# Patient Record
Sex: Male | Born: 1940 | Race: White | Hispanic: No | Marital: Married | Smoking: Never smoker
Health system: Southern US, Community
[De-identification: ages and names within clinical notes are randomized; demographics above are authoritative.]

## PROBLEM LIST (undated history)

## (undated) DIAGNOSIS — I10 Essential (primary) hypertension: Secondary | ICD-10-CM

## (undated) DIAGNOSIS — H409 Unspecified glaucoma: Secondary | ICD-10-CM

## (undated) DIAGNOSIS — K219 Gastro-esophageal reflux disease without esophagitis: Secondary | ICD-10-CM

## (undated) DIAGNOSIS — M1990 Unspecified osteoarthritis, unspecified site: Secondary | ICD-10-CM

## (undated) HISTORY — DX: Unspecified osteoarthritis, unspecified site: M19.90

## (undated) HISTORY — DX: Gastro-esophageal reflux disease without esophagitis: K21.9

## (undated) HISTORY — DX: Essential (primary) hypertension: I10

## (undated) HISTORY — DX: Unspecified glaucoma: H40.9

---

## 1970-12-31 HISTORY — PX: VASECTOMY: SHX75

## 1979-09-01 HISTORY — PX: TONSILLECTOMY AND ADENOIDECTOMY: SUR1326

## 1988-12-31 HISTORY — PX: ROTATOR CUFF REPAIR: SHX139

## 1992-12-31 HISTORY — PX: CARPAL TUNNEL RELEASE: SHX101

## 2014-03-24 ENCOUNTER — Encounter: Payer: Self-pay | Admitting: *Deleted

## 2014-04-20 ENCOUNTER — Encounter: Payer: Self-pay | Admitting: Family Medicine

## 2014-04-20 ENCOUNTER — Ambulatory Visit (INDEPENDENT_AMBULATORY_CARE_PROVIDER_SITE_OTHER): Payer: Medicare Other | Admitting: Family Medicine

## 2014-04-20 VITALS — BP 138/82 | HR 62 | Temp 97.6°F | Resp 14 | Ht 72.0 in | Wt 237.0 lb

## 2014-04-20 DIAGNOSIS — N529 Male erectile dysfunction, unspecified: Secondary | ICD-10-CM

## 2014-04-20 DIAGNOSIS — M7730 Calcaneal spur, unspecified foot: Secondary | ICD-10-CM

## 2014-04-20 DIAGNOSIS — D229 Melanocytic nevi, unspecified: Secondary | ICD-10-CM

## 2014-04-20 DIAGNOSIS — I10 Essential (primary) hypertension: Secondary | ICD-10-CM

## 2014-04-20 DIAGNOSIS — M159 Polyosteoarthritis, unspecified: Secondary | ICD-10-CM

## 2014-04-20 DIAGNOSIS — R0602 Shortness of breath: Secondary | ICD-10-CM

## 2014-04-20 DIAGNOSIS — B354 Tinea corporis: Secondary | ICD-10-CM

## 2014-04-20 DIAGNOSIS — Z85828 Personal history of other malignant neoplasm of skin: Secondary | ICD-10-CM

## 2014-04-20 DIAGNOSIS — D239 Other benign neoplasm of skin, unspecified: Secondary | ICD-10-CM

## 2014-04-20 MED ORDER — CLOTRIMAZOLE-BETAMETHASONE 1-0.05 % EX CREA: 1.0000 "application " | TOPICAL_CREAM | Freq: Two times a day (BID) | CUTANEOUS | Status: DC

## 2014-04-20 MED ORDER — TADALAFIL 20 MG PO TABS: 20.0000 mg | ORAL_TABLET | Freq: Every day | ORAL | Status: DC | PRN

## 2014-04-20 NOTE — Progress Notes (Signed)
Patient ID: Logan Cruz, male   DOB: [birth year], 73 y.o.   MRN: [id number]   Subjective:    Patient ID: Logan Cruz, male    DOB: [birth year], 73 y.o.   MRN: [id number]  Patient presents for New patient CPE, R heel swelling and pain and R leg under knee- dry patchy skin  issue here to establish care. Previous PCP was in [place]. His medications and history were reviewed. He is history of hypertension and acid reflux. He is what pressure has been well-controlled on his current medications. He does complain today about a rash that has been on his left leg for the past month or so. He has tried using some topical hydrocortisone helps with the itch but the rash is still present. He also has noticed a swelling on his left heel for the past couple months which is becoming more more painful with walking. He's not had any particular injury. 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 He is often seen by dermatology in 6 months a cyst because of his recurrent history he's had Mohs procedure done in the past as well. 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, physiological and [place]:  GEN- denies fatigue, fever, weight loss,weakness, recent illness HEENT- denies eye drainage, change in vision, nasal discharge, CVS- denies chest pain, palpitations RESP- denies SOB, cough, wheeze ABD- denies N/V, change in stools, abd pain GU- denies dysuria, hematuria, dribbling, incontinence MSK- +joint pain, muscle aches, injury Neuro- denies headache, dizziness, syncope, seizure activity       Objective:    BP 138/82  Pulse 62  Temp(Src) 97.6 F (36.4 C) (Oral)  Resp 14  Ht 6' (1.829 m)  Wt 237 lb (107.502 kg)  BMI 32.14 kg/m2 GEN- NAD, alert and oriented x3 HEENT- PERRL,  EOMI, non injected sclera, pink conjunctiva, MMM, oropharynx clear, Wears hearing Aide, Canals with mild wax Neck- Supple,  CVS- RRR, no murmur RESP-CTAB ABD-NABS,soft,NT,ND Skin- erythematous scaley lesion left leg- multiple moles, seborrheic keratosis, tiny hemangiomas, sun damage EXT- No edema, Swelling of left heel and achilles tendon insertion region, mild TTP no erythema,Fair ROM left ankle Pulses- Radial, DP- 2+        Assessment & Plan:      Problem List Items Addressed This Visit   Tinea corporis   Relevant Medications      LOTRISONE 1-0.05 % EX CREA   SOB (shortness of breath)   Heel spur   Relevant Orders      DG Foot Complete Left   Generalized OA   Essential hypertension, benign - Primary   Relevant Medications      tadalafil (CIALIS) 20 MG tablet   Erectile dysfunction      Note: This dictation was prepared with Dragon dictation along with smaller phrase technology. Any transcriptional errors that result from this process are unintentional.

## 2014-04-20 NOTE — Assessment & Plan Note (Signed)
Cialis to be refill

## 2014-04-20 NOTE — Assessment & Plan Note (Signed)
Continue Celebrex. 

## 2014-04-20 NOTE — Assessment & Plan Note (Signed)
Blood pressure well controlled

## 2014-04-20 NOTE — Assessment & Plan Note (Signed)
X-ray of foot and heel and refer to a dietitian. He might have some calcifications along the Achilles tendon as well

## 2014-04-20 NOTE — Assessment & Plan Note (Signed)
He's been evaluated by cardiology as well as had imaging of the chest for shortness of breath this is thought to be due to deconditioning.

## 2014-04-20 NOTE — Assessment & Plan Note (Signed)
Lotrisone cream Reviewed by KOH

## 2014-04-20 NOTE — Patient Instructions (Signed)
Continue current medications Get the xray done of your heel [place]  Return for fasting labs  Referral to podiatry Use cream for the rash  Referral to dermatology Release of [address] and [place] /  [facility] Dermatology F/U 3 months

## 2014-04-21 ENCOUNTER — Other Ambulatory Visit: Payer: Medicare Other

## 2014-04-21 DIAGNOSIS — R0602 Shortness of breath: Secondary | ICD-10-CM

## 2014-04-21 DIAGNOSIS — I10 Essential (primary) hypertension: Secondary | ICD-10-CM

## 2014-04-21 DIAGNOSIS — Z0000 Encounter for general adult medical examination without abnormal findings: Secondary | ICD-10-CM

## 2014-04-21 DIAGNOSIS — M159 Polyosteoarthritis, unspecified: Secondary | ICD-10-CM

## 2014-04-21 LAB — CBC WITH DIFFERENTIAL/PLATELET
BASOS PCT: 0 % (ref 0–1)
Basophils Absolute: 0 10*3/uL (ref 0.0–0.1)
EOS PCT: 1 % (ref 0–5)
Eosinophils Absolute: 0.1 10*3/uL (ref 0.0–0.7)
HEMATOCRIT: 40.7 % (ref 39.0–52.0)
Hemoglobin: 14.1 g/dL (ref 13.0–17.0)
LYMPHS PCT: 25 % (ref 12–46)
Lymphs Abs: 1.4 10*3/uL (ref 0.7–4.0)
MCH: 29.3 pg (ref 26.0–34.0)
MCHC: 34.6 g/dL (ref 30.0–36.0)
MCV: 84.6 fL (ref 78.0–100.0)
MONO ABS: 0.6 10*3/uL (ref 0.1–1.0)
Monocytes Relative: 11 % (ref 3–12)
Neutro Abs: 3.5 10*3/uL (ref 1.7–7.7)
Neutrophils Relative %: 63 % (ref 43–77)
Platelets: 156 10*3/uL (ref 150–400)
RBC: 4.81 MIL/uL (ref 4.22–5.81)
RDW: 14.4 % (ref 11.5–15.5)
WBC: 5.5 10*3/uL (ref 4.0–10.5)

## 2014-04-21 LAB — LIPID PANEL
Cholesterol: 169 mg/dL (ref 0–200)
HDL: 29 mg/dL — AB (ref >39)
LDL CALC: 115 mg/dL — AB (ref 0–99)
Total CHOL/HDL Ratio: 5.8 Ratio
Triglycerides: 125 mg/dL (ref <150)
VLDL: 25 mg/dL (ref 0–40)

## 2014-04-21 LAB — COMPLETE METABOLIC PANEL WITH GFR
ALT: 22 U/L (ref 0–53)
AST: 20 U/L (ref 0–37)
Albumin: 3.8 g/dL (ref 3.5–5.2)
Alkaline Phosphatase: 114 U/L (ref 39–117)
BUN: 20 mg/dL (ref 6–23)
CO2: 27 mEq/L (ref 19–32)
Calcium: 8.6 mg/dL (ref 8.4–10.5)
Chloride: 106 mEq/L (ref 96–112)
Creat: 0.88 mg/dL (ref 0.50–1.35)
GFR, Est African American: >89 mL/min
GFR, Est Non African American: 86 mL/min
Glucose, Bld: 99 mg/dL (ref 70–99)
Potassium: 4.5 mEq/L (ref 3.5–5.3)
Sodium: 141 mEq/L (ref 135–145)
Total Bilirubin: 0.4 mg/dL (ref 0.2–1.2)
Total Protein: 6.8 g/dL (ref 6.0–8.3)

## 2014-04-22 LAB — PSA, MEDICARE: PSA: 1.49 ng/mL (ref <=4.00)

## 2014-04-30 ENCOUNTER — Encounter: Payer: Self-pay | Admitting: Podiatry

## 2014-04-30 ENCOUNTER — Ambulatory Visit (INDEPENDENT_AMBULATORY_CARE_PROVIDER_SITE_OTHER): Payer: Medicare Other | Admitting: Podiatry

## 2014-04-30 VITALS — BP 150/90 | HR 49 | Ht 72.0 in | Wt 237.0 lb

## 2014-04-30 DIAGNOSIS — M659 Synovitis and tenosynovitis, unspecified: Secondary | ICD-10-CM | POA: Insufficient documentation

## 2014-04-30 DIAGNOSIS — M7730 Calcaneal spur, unspecified foot: Secondary | ICD-10-CM

## 2014-04-30 MED ORDER — LIDOCAINE 5 % EX PTCH: 1.0000 | MEDICATED_PATCH | CUTANEOUS | Status: DC

## 2014-04-30 NOTE — Patient Instructions (Signed)
Seen left heel pain.  Will try ankle brace and arch binder. Need Custom orthotics. Return in 2 weeks.

## 2014-04-30 NOTE — Progress Notes (Signed)
Subjective: 73 year old male presents accompanied by wife complaining of pain in left posterior medial heel since last Summer and the bump is getting bigger. Also has pain on upper part of instep area left foot since yesterday. He is on feet constantly doing household projects.   Review of Systems - General ROS: negative for - chills, fatigue, fever, night sweats, sleep disturbance, weight gain or weight loss Ophthalmic ROS: Need new glasses. ENT ROS: negative Endocrine ROS: negative Respiratory ROS: Started to have difficulty breathing. Been checked out and no abnormal findings. Cardiovascular ROS: no chest pain or dyspnea on exertion Gastrointestinal ROS: no abdominal pain, change in bowel habits, or black or bloody stools Genito-Urinary ROS: Been checked out for Prostate.  Musculoskeletal ROS: negative Neurological ROS: no TIA or stroke symptoms Dermatological ROS: Has had several skin cancers and been seeing Dermatologist.   Objective: Dermatologic: Normal nail no abnormal skin lesions. Vascular: All pedal pulses are palpable. No soft tissue edema or erythema noted at the affected site of left heel. There is minute increase in skin temperature on left posterior heel. Orthopedic: Cavus type foot with large posterior bump left foot at Achilles tendon attachment site.  Pain at left instep near Naviculocuneiform joint area with weight bearing.  Radiographic examination reveal high arch foot with posterior spur and increased sclerosis at tendon attachment site left heel posterior.  No other significant abnormal findings.   Assessment: 1. Haglund's deformity left heel.  2. Tenosynovitis left instep at about Naviculocuneiform joint.   Plan: Reviewed clinical findings and available treatment options. Arch binder with pad dispensed for the left foot to wear during weight bearing. Ankle support dispensed to limit excess forefoot abduction during ambulation .  Explained the benefit of Custom  orthotics.  Will try Lidoderm patch as needed.

## 2014-05-12 ENCOUNTER — Encounter: Payer: Self-pay | Admitting: Podiatry

## 2014-05-12 ENCOUNTER — Ambulatory Visit (INDEPENDENT_AMBULATORY_CARE_PROVIDER_SITE_OTHER): Payer: Medicare Other | Admitting: Podiatry

## 2014-05-12 VITALS — BP 151/82 | HR 53

## 2014-05-12 DIAGNOSIS — M659 Synovitis and tenosynovitis, unspecified: Secondary | ICD-10-CM

## 2014-05-12 DIAGNOSIS — M79609 Pain in unspecified limb: Secondary | ICD-10-CM

## 2014-05-12 DIAGNOSIS — M7730 Calcaneal spur, unspecified foot: Secondary | ICD-10-CM

## 2014-05-12 MED ORDER — DICLOFENAC EPOLAMINE 1.3 % TD PTCH: 1.0000 | MEDICATED_PATCH | Freq: Two times a day (BID) | TRANSDERMAL | Status: DC

## 2014-05-12 NOTE — Patient Instructions (Signed)
Seen for left foot and heel pain. Improving with arch pad, ankle support, and Lidoderm patch. Will try Flector patch.  Lost arch pad replaced. Return as needed.

## 2014-05-12 NOTE — Progress Notes (Signed)
Subjective:  73 year old male presents accompanied by wife stating that ankle brace, metatarsal binder, and patches helped. Near instep area pain has subsided. Back of heel still hurt upon ambulation.   Objective:  No new change other than some warmth at the posterior heel left.   Assessment: 1. Haglund's deformity with inflammation left heel.  2. Tenosynovitis left instep at about Naviculocuneiform joint, improving with treatment.   Plan: Reviewed clinical findings and available treatment options.  Lost Arch binder with pad replaced.  Continue with Ankle support dispensed to limit excess forefoot abduction during ambulation .  Explained the benefit of Custom orthotics.  Will try Flector patch in place of Lidoderm patch to decrease inflammation.

## 2014-05-14 ENCOUNTER — Ambulatory Visit: Payer: Medicare Other | Admitting: Podiatry

## 2014-07-21 ENCOUNTER — Ambulatory Visit (INDEPENDENT_AMBULATORY_CARE_PROVIDER_SITE_OTHER): Payer: Medicare Other | Admitting: Family Medicine

## 2014-07-21 ENCOUNTER — Encounter: Payer: Self-pay | Admitting: Family Medicine

## 2014-07-21 VITALS — BP 142/80 | HR 64 | Temp 97.8°F | Resp 12 | Ht 71.0 in | Wt 235.0 lb

## 2014-07-21 DIAGNOSIS — M159 Polyosteoarthritis, unspecified: Secondary | ICD-10-CM

## 2014-07-21 DIAGNOSIS — Z23 Encounter for immunization: Secondary | ICD-10-CM

## 2014-07-21 DIAGNOSIS — I10 Essential (primary) hypertension: Secondary | ICD-10-CM

## 2014-07-21 NOTE — Assessment & Plan Note (Signed)
Continue use of Celebrex as needed. Currently he is using more of the diclofenac patch

## 2014-07-21 NOTE — Patient Instructions (Signed)
Continue current medications Prevnar 13 given  F/U 6 months

## 2014-07-21 NOTE — Assessment & Plan Note (Signed)
Blood pressure is well-controlled no change the medication. His last set of labs were reviewed which were unremarkable

## 2014-07-21 NOTE — Progress Notes (Signed)
Patient ID: Logan Cruz, male   DOB: February 02, 1941, 73 y.o.   MRN: [id number]   Subjective:    Patient ID: Logan Cruz, male    DOB: [birth year], 73 y.o.   MRN: [id number]  Patient presents for 3 month F/U  patient here to follow chronic medical problems. He has no specific concerns today. He still being followed by podiatry for heel spurs is currently using the Flector patch which helps. He is tolerating his medications without any difficulties. He does have numerous skin moles as well as some actinic keratoses across his for head which she's going to have treated in October.  He is due for Prevnar 13    Review Of Systems:  GEN- denies fatigue, fever, weight loss,weakness, recent illness HEENT- denies eye drainage, change in vision, nasal discharge, CVS- denies chest pain, palpitations RESP- denies SOB, cough, wheeze ABD- denies N/V, change in stools, abd pain GU- denies dysuria, hematuria, dribbling, incontinence MSK- denies joint pain, muscle aches, injury Neuro- denies headache, dizziness, syncope, seizure activity       Objective:    BP 142/80  Pulse 64  Temp(Src) 97.8 F (36.6 C) (Oral)  Resp 12  Ht 5\' 11"  (1.803 m)  Wt 235 lb (106.595 kg)  BMI 32.79 kg/m2 GEN- NAD, alert and oriented x3 HEENT- PERRL, EOMI, non injected sclera, pink conjunctiva, MMM, oropharynx clear Neck- Supple, CVS- RRR, no murmur RESP-CTAB Skin- multiple scaly raised erythematous lesions on his forehead, flesh colored papules at nape of neck and right occipital region, multiple seborrheic keratosis arms, legs EXT- No edema Pulses- Radial 2+        Assessment & Plan:      Problem List Items Addressed This Visit   None    Visit Diagnoses   Need for prophylactic vaccination against Streptococcus pneumoniae (pneumococcus)    -  Primary    Relevant Orders       Pneumococcal conjugate vaccine 13-valent (Completed)       Note: This dictation was prepared with Dragon dictation along  with smaller phrase technology. Any transcriptional errors that result from this process are unintentional.

## 2014-11-24 ENCOUNTER — Encounter: Payer: Self-pay | Admitting: Family Medicine

## 2014-12-27 ENCOUNTER — Telehealth: Payer: Self-pay | Admitting: Family Medicine

## 2014-12-27 MED ORDER — CELECOXIB 100 MG PO CAPS: 100.0000 mg | ORAL_CAPSULE | Freq: Every day | ORAL | Status: DC | PRN

## 2014-12-27 NOTE — Telephone Encounter (Signed)
Prescription sent to pharmacy.

## 2014-12-27 NOTE — Telephone Encounter (Signed)
[phone] [facility]  Pt is needing a refill on Celecoxib (CELEBREX PO) ( he is wanting it to go to [facility] because he is out)

## 2015-01-21 ENCOUNTER — Ambulatory Visit: Payer: Medicare Other | Admitting: Family Medicine

## 2015-01-24 ENCOUNTER — Encounter: Payer: Self-pay | Admitting: Family Medicine

## 2015-01-24 ENCOUNTER — Ambulatory Visit (INDEPENDENT_AMBULATORY_CARE_PROVIDER_SITE_OTHER): Payer: Medicare Other | Admitting: Family Medicine

## 2015-01-24 VITALS — BP 140/78 | HR 70 | Temp 99.8°F | Resp 14 | Ht 71.0 in | Wt 242.0 lb

## 2015-01-24 DIAGNOSIS — I10 Essential (primary) hypertension: Secondary | ICD-10-CM

## 2015-01-24 DIAGNOSIS — Z23 Encounter for immunization: Secondary | ICD-10-CM

## 2015-01-24 DIAGNOSIS — M75101 Unspecified rotator cuff tear or rupture of right shoulder, not specified as traumatic: Secondary | ICD-10-CM | POA: Insufficient documentation

## 2015-01-24 DIAGNOSIS — F0390 Unspecified dementia without behavioral disturbance: Secondary | ICD-10-CM | POA: Insufficient documentation

## 2015-01-24 DIAGNOSIS — R4189 Other symptoms and signs involving cognitive functions and awareness: Secondary | ICD-10-CM

## 2015-01-24 LAB — CBC WITH DIFFERENTIAL/PLATELET
Basophils Absolute: 0 10*3/uL (ref 0.0–0.1)
Basophils Relative: 0 % (ref 0–1)
Eosinophils Absolute: 0.1 10*3/uL (ref 0.0–0.7)
Eosinophils Relative: 1 % (ref 0–5)
HCT: 44.3 % (ref 39.0–52.0)
HEMOGLOBIN: 15.4 g/dL (ref 13.0–17.0)
LYMPHS ABS: 1.4 10*3/uL (ref 0.7–4.0)
LYMPHS PCT: 24 % (ref 12–46)
MCH: 29.1 pg (ref 26.0–34.0)
MCHC: 34.8 g/dL (ref 30.0–36.0)
MCV: 83.7 fL (ref 78.0–100.0)
MONOS PCT: 13 % — AB (ref 3–12)
MPV: 10.2 fL (ref 8.6–12.4)
Monocytes Absolute: 0.8 10*3/uL (ref 0.1–1.0)
NEUTROS ABS: 3.6 10*3/uL (ref 1.7–7.7)
Neutrophils Relative %: 62 % (ref 43–77)
Platelets: 165 10*3/uL (ref 150–400)
RBC: 5.29 MIL/uL (ref 4.22–5.81)
RDW: 13.3 % (ref 11.5–15.5)
WBC: 5.8 10*3/uL (ref 4.0–10.5)

## 2015-01-24 LAB — COMPREHENSIVE METABOLIC PANEL
ALK PHOS: 116 U/L (ref 39–117)
ALT: 22 U/L (ref 0–53)
AST: 23 U/L (ref 0–37)
Albumin: 4 g/dL (ref 3.5–5.2)
BILIRUBIN TOTAL: 0.5 mg/dL (ref 0.2–1.2)
BUN: 18 mg/dL (ref 6–23)
CALCIUM: 9 mg/dL (ref 8.4–10.5)
CHLORIDE: 107 meq/L (ref 96–112)
CO2: 21 mEq/L (ref 19–32)
CREATININE: 0.96 mg/dL (ref 0.50–1.35)
Glucose, Bld: 78 mg/dL (ref 70–99)
Potassium: 4.4 mEq/L (ref 3.5–5.3)
Sodium: 140 mEq/L (ref 135–145)
TOTAL PROTEIN: 7.4 g/dL (ref 6.0–8.3)

## 2015-01-24 MED ORDER — CELECOXIB 100 MG PO CAPS: 100.0000 mg | ORAL_CAPSULE | Freq: Every day | ORAL | Status: DC | PRN

## 2015-01-24 MED ORDER — AMLODIPINE BESY-BENAZEPRIL HCL 5-20 MG PO CAPS: 1.0000 | ORAL_CAPSULE | Freq: Every day | ORAL | Status: DC

## 2015-01-24 MED ORDER — ESOMEPRAZOLE MAGNESIUM 40 MG PO CPDR: 40.0000 mg | DELAYED_RELEASE_CAPSULE | Freq: Every day | ORAL | Status: DC

## 2015-01-24 NOTE — Progress Notes (Signed)
Patient ID: Logan Cruz, male   DOB: [birth year], 74 y.o.   MRN: [id number]   Subjective:    Patient ID: Logan Cruz, male    DOB: 01/22/1941, 74 y.o.   MRN: [id number]  Patient presents for 6 month F/U  patient here to follow chronic medical problems. He does complain of right shoulder pain is history of surgery on his rotary cuff in the past where he tore some tendons about a month ago he actually fell climbing down off a ladder and landed on a hammer and also his right shoulder since then his appetite problems with range of motion and pain he has been taking his Celebrex on a daily basis because of this. He also name of the physical therapist but he has not seen an orthopedist yet.  He brought up that he was noticing some change with his memory is father has history of dementia which started in his 36s and he passed away in his 52s. He's had difficulty with his memory for the past few years but he is finding more difficulty with word finding he states he is able to do his regular activities he still helps his wife with pain in the bills. He denies any recent strokelike symptoms.  HTN- taking meds as prescribed  Due for flu shot  Review Of Systems:  GEN- denies fatigue, fever, weight loss,weakness, recent illness HEENT- denies eye drainage, change in vision, nasal discharge, CVS- denies chest pain, palpitations RESP- denies SOB, cough, wheeze ABD- denies N/V, change in stools, abd pain GU- denies dysuria, hematuria, dribbling, incontinence MSK- + joint pain, muscle aches, injury Neuro- denies headache, dizziness, syncope, seizure activity       Objective:    BP 140/78 mmHg  Pulse 70  Temp(Src) 99.8 F (37.7 C) (Oral)  Resp 14  Ht 5\' 11"  (1.803 m)  Wt 242 lb (109.77 kg)  BMI 33.77 kg/m2 GEN- NAD, alert and oriented x3 HEENT- PERRL, EOMI, non injected sclera, pink conjunctiva, MMM, oropharynx clear Neck- Supple,  No bruit CVS- RRR, no murmur RESP-CTAB Neuro- CNII-XII  intact, no focal deficits, able to recall 3 words, normal orientation, no stuttering, takes time to think of words, spelled 4/5 MSK- Decreased ROM right shoulder UE compared to left, + empty can, ? impinment with neers, biceps in tact, decreased strength RUE compared to left EXT- No edema Pulses- Radial 2+        Assessment & Plan:      Problem List Items Addressed This Visit    None      Note: This dictation was prepared with Dragon dictation along with smaller phrase technology. Any transcriptional errors that result from this process are unintentional.

## 2015-01-24 NOTE — Assessment & Plan Note (Signed)
Changes noted, however he declines further work up at this time, it does appear he may have some early changes, consistent with development of dementia and this runs in his family We discussed trying Aricept and hold on MRI and other work up he declines at this time

## 2015-01-24 NOTE — Patient Instructions (Addendum)
Continue current blood pressure medications REferral to orthopedics for the shoulder We will call with lab results  F/U 4 months

## 2015-01-24 NOTE — Assessment & Plan Note (Signed)
Concerned about reinjury to his rotary cuff which she had surgery on the past. I will send in orthopedics to be evaluated. He will continue the Celebrex once a day which is helping with pain relief. I would not recommend physical therapy until he is seen by them.

## 2015-01-24 NOTE — Assessment & Plan Note (Signed)
Well controlled, no change to meds, check renal function

## 2015-01-25 ENCOUNTER — Encounter: Payer: Self-pay | Admitting: *Deleted

## 2015-05-27 ENCOUNTER — Ambulatory Visit: Payer: Medicare Other | Admitting: Family Medicine

## 2015-06-08 ENCOUNTER — Encounter: Payer: Self-pay | Admitting: Family Medicine

## 2015-06-08 ENCOUNTER — Ambulatory Visit (INDEPENDENT_AMBULATORY_CARE_PROVIDER_SITE_OTHER): Payer: Medicare Other | Admitting: Family Medicine

## 2015-06-08 VITALS — BP 142/84 | HR 78 | Temp 98.7°F | Resp 16 | Ht 71.0 in | Wt 238.0 lb

## 2015-06-08 DIAGNOSIS — M159 Polyosteoarthritis, unspecified: Secondary | ICD-10-CM

## 2015-06-08 DIAGNOSIS — Z125 Encounter for screening for malignant neoplasm of prostate: Secondary | ICD-10-CM

## 2015-06-08 DIAGNOSIS — I10 Essential (primary) hypertension: Secondary | ICD-10-CM | POA: Diagnosis not present

## 2015-06-08 DIAGNOSIS — N400 Enlarged prostate without lower urinary tract symptoms: Secondary | ICD-10-CM | POA: Insufficient documentation

## 2015-06-08 MED ORDER — TAMSULOSIN HCL 0.4 MG PO CAPS: 0.4000 mg | ORAL_CAPSULE | Freq: Every day | ORAL | Status: DC

## 2015-06-08 MED ORDER — CELECOXIB 100 MG PO CAPS: 100.0000 mg | ORAL_CAPSULE | Freq: Two times a day (BID) | ORAL | Status: DC | PRN

## 2015-06-08 NOTE — Progress Notes (Signed)
Patient ID: Logan Cruz, male   DOB: 07/26/1941, 74 y.o.   MRN: [id number]   Subjective:    Patient ID: Logan Cruz, male    DOB: 1941/03/14, 74 y.o.   MRN: [id number]  Patient presents for 4 month F/U  issue here to follow-up chronic medical problems. He continues to have joint pains he is having more stiffness now in his hands mostly his right thumb. He is taking his Celebrex which does help. He was seen by orthopedics for his shoulders but he is just doing physical therapy he did not one any injections done. He also has had some urinary urgency and states that his strength starts to dribble off when he is urinating. He denies any blood in the urine denies any dysuria. Medications were reviewed. In general he states that he feels well and still very active.     Review Of Systems:  GEN- denies fatigue, fever, weight loss,weakness, recent illness HEENT- denies eye drainage, change in vision, nasal discharge, CVS- denies chest pain, palpitations RESP- denies SOB, cough, wheeze ABD- denies N/V, change in stools, abd pain GU- denies dysuria, hematuria, +dribbling, incontinence MSK- + joint pain, muscle aches, injury Neuro- denies headache, dizziness, syncope, seizure activity       Objective:    BP 142/84 mmHg  Pulse 78  Temp(Src) 98.7 F (37.1 C) (Oral)  Resp 16  Ht 5\' 11"  (1.803 m)  Wt 238 lb (107.956 kg)  BMI 33.21 kg/m2 GEN- NAD, alert and oriented x3 HEENT- PERRL, EOMI, non injected sclera, pink conjunctiva, MMM, oropharynx clear Neck- Supple, CVS- RRR, no murmur RESP-CTAB ABD-NABS,soft,NT,ND MSK- Decreased ROM, upper ext, bilat hands, no swelling at MIP/PIP, mild TTp base of right thumb, no deformity noted, able to grasp equally EXT- No edema Pulses- Radial 2+        Assessment & Plan:      Problem List Items Addressed This Visit    None      Note: This dictation was prepared with Dragon dictation along with smaller phrase technology. Any  transcriptional errors that result from this process are unintentional.

## 2015-06-08 NOTE — Assessment & Plan Note (Signed)
Increase celebrex to BID, this should help hand as well He does not want a lot of intervention

## 2015-06-08 NOTE — Assessment & Plan Note (Signed)
BP controlled for age and comorbidites, no change to medication

## 2015-06-08 NOTE — Patient Instructions (Addendum)
Continue current meds Return for fasting labs in the morning Celebrex now twice a day for joint pain Flomax for prostate  F/U 4 months for PHYSICAL

## 2015-06-08 NOTE — Assessment & Plan Note (Signed)
Based on age and symptoms treat for BPH, will also check PSA

## 2015-06-09 ENCOUNTER — Other Ambulatory Visit: Payer: Medicare Other

## 2015-06-09 DIAGNOSIS — Z125 Encounter for screening for malignant neoplasm of prostate: Secondary | ICD-10-CM

## 2015-06-09 DIAGNOSIS — I10 Essential (primary) hypertension: Secondary | ICD-10-CM

## 2015-06-09 LAB — COMPREHENSIVE METABOLIC PANEL
ALT: 24 U/L (ref 0–53)
AST: 25 U/L (ref 0–37)
Albumin: 3.8 g/dL (ref 3.5–5.2)
Alkaline Phosphatase: 108 U/L (ref 39–117)
BUN: 20 mg/dL (ref 6–23)
CALCIUM: 8.9 mg/dL (ref 8.4–10.5)
CHLORIDE: 104 meq/L (ref 96–112)
CO2: 28 mEq/L (ref 19–32)
Creat: 1.04 mg/dL (ref 0.50–1.35)
Glucose, Bld: 110 mg/dL — ABNORMAL HIGH (ref 70–99)
POTASSIUM: 4.4 meq/L (ref 3.5–5.3)
Sodium: 139 mEq/L (ref 135–145)
Total Bilirubin: 0.6 mg/dL (ref 0.2–1.2)
Total Protein: 7.2 g/dL (ref 6.0–8.3)

## 2015-06-09 LAB — CBC WITH DIFFERENTIAL/PLATELET
Basophils Absolute: 0 10*3/uL (ref 0.0–0.1)
Basophils Relative: 0 % (ref 0–1)
EOS PCT: 1 % (ref 0–5)
Eosinophils Absolute: 0 10*3/uL (ref 0.0–0.7)
HCT: 43.9 % (ref 39.0–52.0)
Hemoglobin: 14.7 g/dL (ref 13.0–17.0)
Lymphocytes Relative: 32 % (ref 12–46)
Lymphs Abs: 1.4 10*3/uL (ref 0.7–4.0)
MCH: 28.7 pg (ref 26.0–34.0)
MCHC: 33.5 g/dL (ref 30.0–36.0)
MCV: 85.6 fL (ref 78.0–100.0)
MONO ABS: 0.4 10*3/uL (ref 0.1–1.0)
MPV: 10.1 fL (ref 8.6–12.4)
Monocytes Relative: 9 % (ref 3–12)
Neutro Abs: 2.6 10*3/uL (ref 1.7–7.7)
Neutrophils Relative %: 58 % (ref 43–77)
PLATELETS: 160 10*3/uL (ref 150–400)
RBC: 5.13 MIL/uL (ref 4.22–5.81)
RDW: 14.3 % (ref 11.5–15.5)
WBC: 4.5 10*3/uL (ref 4.0–10.5)

## 2015-06-09 LAB — LIPID PANEL
Cholesterol: 167 mg/dL (ref 0–200)
HDL: 25 mg/dL — ABNORMAL LOW (ref >=40)
LDL Cholesterol: 116 mg/dL — ABNORMAL HIGH (ref 0–99)
TRIGLYCERIDES: 131 mg/dL (ref <150)
Total CHOL/HDL Ratio: 6.7 Ratio
VLDL: 26 mg/dL (ref 0–40)

## 2015-06-10 LAB — PSA, MEDICARE: PSA: 1.5 ng/mL (ref <=4.00)

## 2015-09-08 ENCOUNTER — Encounter: Payer: Self-pay | Admitting: Physician Assistant

## 2015-09-08 ENCOUNTER — Ambulatory Visit (INDEPENDENT_AMBULATORY_CARE_PROVIDER_SITE_OTHER): Payer: Medicare Other | Admitting: Physician Assistant

## 2015-09-08 VITALS — BP 142/88 | HR 60 | Temp 97.8°F | Resp 18 | Wt 238.0 lb

## 2015-09-08 DIAGNOSIS — H6123 Impacted cerumen, bilateral: Secondary | ICD-10-CM

## 2015-09-08 NOTE — Progress Notes (Signed)
    Patient ID: Logan Cruz MRN: [id number], DOB: [birth year], 74 y.o. Date of Encounter: 09/08/2015, 9:53 AM    Chief Complaint:  Chief Complaint  Patient presents with  . ears plugged     HPI: 74 y.o. year old white male states that he has chronic history of problems with a lot of cerumen in his ears. Says that in the past he has had to go to the ear doctor to have it removed. Says that it was a lot easier to come here than to drive to [facility]. Wants to go ahead and have them cleared out completely to prevent them from getting worse. No other complaints or concerns today.    Home Meds:   Outpatient Prescriptions Prior to Visit  Medication Sig Dispense Refill  . amLODipine-benazepril (LOTREL) 5-20 MG per capsule Take 1 capsule by mouth daily. 90 capsule 3  . aspirin 81 MG tablet Take 81 mg by mouth daily.    . celecoxib (CELEBREX) 100 MG capsule Take 1 capsule (100 mg total) by mouth 2 (two) times daily as needed. 180 capsule 3  . clotrimazole-betamethasone (LOTRISONE) cream Apply 1 application topically 2 (two) times daily. 30 g 0  . diclofenac (FLECTOR) 1.3 % PTCH Place 1 patch onto the skin 2 (two) times daily. 30 patch 2  . esomeprazole (NEXIUM) 40 MG capsule Take 1 capsule (40 mg total) by mouth daily at 12 noon. 90 capsule 3  . tamsulosin (FLOMAX) 0.4 MG CAPS capsule Take 1 capsule (0.4 mg total) by mouth daily. 90 capsule 3   No facility-administered medications prior to visit.    Allergies: No Known Allergies    Review of Systems: See HPI for pertinent ROS. All other ROS negative.    Physical Exam: Blood pressure 142/88, pulse 60, temperature 97.8 F (36.6 C), temperature source Oral, resp. rate 18, weight 238 lb (107.956 kg)., Body mass index is 33.21 kg/(m^2). General:  WNWD WM. Appears in no acute distress. HEENT: Normocephalic, atraumatic, eyes without discharge, sclera non-icteric, nares are without discharge. Bilateral ear canals with  cerumen--obstructing approx 3/4 of ear canal on each ear.  Neck: Supple. No thyromegaly. No lymphadenopathy. Lungs: Clear bilaterally to auscultation without wheezes, rales, or rhonchi. Breathing is unlabored. Heart: Regular rhythm. No murmurs, rubs, or gallops. Msk:  Strength and tone normal for age. Extremities/Skin: Warm and dry.  Neuro: Alert and oriented X 3. Moves all extremities spontaneously. Gait is normal. CNII-XII grossly in tact. Psych:  Responds to questions appropriately with a normal affect.     ASSESSMENT AND PLAN:  74 y.o. year old male with  1. Cerumen impaction, bilateral We will irrigate both ears bilaterally. He is also aware of over-the-counter drops to use on a routine basis to prevent / decrease rate of reaccumulation. F/U PRN.   Signed, [address] [place], [place], [facility] 09/08/2015 9:53 AM

## 2015-10-11 ENCOUNTER — Encounter: Payer: Self-pay | Admitting: Family Medicine

## 2015-10-11 ENCOUNTER — Ambulatory Visit (INDEPENDENT_AMBULATORY_CARE_PROVIDER_SITE_OTHER): Payer: Medicare Other | Admitting: Family Medicine

## 2015-10-11 VITALS — BP 138/80 | HR 82 | Temp 98.4°F | Resp 16 | Ht 71.0 in | Wt 241.0 lb

## 2015-10-11 DIAGNOSIS — Z0000 Encounter for general adult medical examination without abnormal findings: Secondary | ICD-10-CM

## 2015-10-11 DIAGNOSIS — I491 Atrial premature depolarization: Secondary | ICD-10-CM | POA: Diagnosis not present

## 2015-10-11 DIAGNOSIS — I10 Essential (primary) hypertension: Secondary | ICD-10-CM

## 2015-10-11 DIAGNOSIS — M159 Polyosteoarthritis, unspecified: Secondary | ICD-10-CM | POA: Diagnosis not present

## 2015-10-11 DIAGNOSIS — R7302 Impaired glucose tolerance (oral): Secondary | ICD-10-CM

## 2015-10-11 DIAGNOSIS — Z23 Encounter for immunization: Secondary | ICD-10-CM

## 2015-10-11 DIAGNOSIS — R9431 Abnormal electrocardiogram [ECG] [EKG]: Secondary | ICD-10-CM

## 2015-10-11 DIAGNOSIS — N400 Enlarged prostate without lower urinary tract symptoms: Secondary | ICD-10-CM | POA: Diagnosis not present

## 2015-10-11 DIAGNOSIS — R079 Chest pain, unspecified: Secondary | ICD-10-CM | POA: Diagnosis not present

## 2015-10-11 LAB — CBC WITH DIFFERENTIAL/PLATELET
BASOS PCT: 0 % (ref 0–1)
Basophils Absolute: 0 10*3/uL (ref 0.0–0.1)
EOS ABS: 0 10*3/uL (ref 0.0–0.7)
EOS PCT: 1 % (ref 0–5)
HCT: 44.9 % (ref 39.0–52.0)
Hemoglobin: 14.8 g/dL (ref 13.0–17.0)
Lymphocytes Relative: 28 % (ref 12–46)
Lymphs Abs: 1.2 10*3/uL (ref 0.7–4.0)
MCH: 28.5 pg (ref 26.0–34.0)
MCHC: 33 g/dL (ref 30.0–36.0)
MCV: 86.3 fL (ref 78.0–100.0)
MONOS PCT: 8 % (ref 3–12)
MPV: 9.9 fL (ref 8.6–12.4)
Monocytes Absolute: 0.3 10*3/uL (ref 0.1–1.0)
NEUTROS PCT: 63 % (ref 43–77)
Neutro Abs: 2.6 10*3/uL (ref 1.7–7.7)
PLATELETS: 162 10*3/uL (ref 150–400)
RBC: 5.2 MIL/uL (ref 4.22–5.81)
RDW: 14.1 % (ref 11.5–15.5)
WBC: 4.2 10*3/uL (ref 4.0–10.5)

## 2015-10-11 LAB — BASIC METABOLIC PANEL
BUN: 17 mg/dL (ref 7–25)
CALCIUM: 8.6 mg/dL (ref 8.6–10.3)
CO2: 27 mmol/L (ref 20–31)
Chloride: 103 mmol/L (ref 98–110)
Creat: 0.92 mg/dL (ref 0.70–1.18)
Glucose, Bld: 121 mg/dL — ABNORMAL HIGH (ref 70–99)
Potassium: 4 mmol/L (ref 3.5–5.3)
Sodium: 137 mmol/L (ref 135–146)

## 2015-10-11 LAB — TSH: TSH: 3.069 u[IU]/mL (ref 0.350–4.500)

## 2015-10-11 NOTE — Assessment & Plan Note (Signed)
Well controlled 

## 2015-10-11 NOTE — Patient Instructions (Addendum)
Continue current medications Add- Vitamin B 12- up to 1000mg  over the counter once a day  Flu shot given Referral to cardiology  Return stool cards F/U 4 MONTHS

## 2015-10-11 NOTE — Progress Notes (Signed)
Patient ID: Logan Cruz, male   DOB: November 29, 1941, 74 y.o.   MRN: [id number] Subjective:   Patient presents for Medicare Annual/Subsequent preventive examination.    Pt here for annual wellness. He reinjured his right shoulder water sking this summer, now back to his exercises given by chiropracter, also taking Celebrex.   BPH- does not take Flomax every night so not sure if it is helping his urine stream   CP- has had a few intermittent episodes of lower left chest/upper abdominal chest pain, no N/V associated, not associated with exertion,no diaphoresis, very short lived for the past 6 months. Started around the same time his daughter had a Heart attack   Review Past Medical/Family/Social: Per EMR    Risk Factors  Current exercise habits: Walks Dietary issues discussed: Yes  Cardiac risk factors: Obesity (BMI >= 30 kg/m2). HTN  Depression Screen  (Note: if answer to either of the following is "Yes", a more complete depression screening is indicated)  Over the past two weeks, have you felt down, depressed or hopeless? No Over the past two weeks, have you felt little interest or pleasure in doing things? No Have you lost interest or pleasure in daily life? No Do you often feel hopeless? No Do you cry easily over simple problems? No   Activities of Daily Living  In your present state of health, do you have any difficulty performing the following activities?:  Driving? No  Managing money? No  Feeding yourself? No  Getting from bed to chair? No  Climbing a flight of stairs? No  Preparing food and eating?: No  Bathing or showering? No  Getting dressed: No  Getting to the toilet? No  Using the toilet:No  Moving around from place to place: No  In the past year have you fallen or had a near fall?:No  Are you sexually active? No  Do you have more than one partner? No   Hearing Difficulties: Yes- wears hearing aids Do you often ask people to speak up or repeat themselves? No  Do  you experience ringing or noises in your ears? No Do you have difficulty understanding soft or whispered voices? No  Do you feel that you have a problem with memory? No Do you often misplace items? No  Do you feel safe at home? Yes  Cognitive Testing  Alert? Yes Normal Appearance?Yes  Oriented to person? Yes Place? Yes  Time? Yes  Recall of three objects? Yes  Can perform simple calculations? Yes  Displays appropriate judgment?Yes  Can read the correct time from a watch face?Yes   List the Names of Other Physician/Practitioners you currently use:     Eye doctor,podiatry, chiropracter   Screening Tests / Date Colonoscopy  -Declines                    Zostavax - Declines  Influenza Vaccine - due today  Tetanus/tdap- unable to afford   ROS:  GEN- + fatigue, fever, weight loss,weakness, recent illness HEENT- denies eye drainage, change in vision, nasal discharge, CVS- + chest pain, palpitations RESP- denies SOB, cough, wheeze ABD- denies N/V, change in stools, abd pain GU- denies dysuria, hematuria, dribbling, incontinence MSK- + joint pain, muscle aches, injury Neuro- denies headache, dizziness, syncope, seizure activity  PHYSICAL: GEN- NAD, alert and oriented x3 HEENT- PERRL, EOMI, non injected sclera, pink conjunctiva, MMM, oropharynx clear Neck- Supple, no Bruit CVS- RRR, no murmur, Few PVC RESP-CTAB ABD-NABS,soft,NT,ND EXT- No edema Pulses- Radial, DP- 2+  EKG- NSR, PAC, neg T waves I , II, III, lead  V5, V6, multiple PAC  Assessment:    Annual wellness medicare exam   Plan:    During the course of the visit the patient was educated and counseled about appropriate screening and preventive services including:  Flu shot given  Colorectal cancer screening - Stool cards given Previous labs -elevated fasting glucose and he has gained weight, check A1C  Screen Neg  for depression.   Diet review for nutrition referral? Yes ____ Not Indicated __x__  -- Discussed  dietary changes  Patient Instructions (the written plan) was given to the patient.  Medicare Attestation  I have personally reviewed:  The patient's medical and social history  Their use of alcohol, tobacco or illicit drugs  Their current medications and supplements  The patient's functional ability including ADLs,fall risks, home safety risks, cognitive, and hearing and visual impairment  Diet and physical activities  Evidence for depression or mood disorders  The patient's weight, height, BMI, and visual acuity have been recorded in the chart. I have made referrals, counseling, and provided education to the patient based on review of the above and I have provided the patient with a written personalized care plan for preventive services.

## 2015-10-11 NOTE — Assessment & Plan Note (Signed)
Continue celebrex.  

## 2015-10-12 LAB — HEMOGLOBIN A1C
Hgb A1c MFr Bld: 6.4 % — ABNORMAL HIGH (ref <5.7)
MEAN PLASMA GLUCOSE: 137 mg/dL — AB (ref <117)

## 2015-10-25 ENCOUNTER — Telehealth: Payer: Self-pay | Admitting: Family Medicine

## 2015-10-25 NOTE — Telephone Encounter (Signed)
Call placed to patient wife.   Reports that patient has root canal scheduled on Wednesday, 10/26/2015 with dentist. Patient has not seen cardiology at this time, but has an appointment on 10/28/2015.  Patient is not undergoing general anesthesia, but wanted to make sure it was ok to undergo root canal before seeing cardiology.   MD please advise.

## 2015-10-25 NOTE — Telephone Encounter (Signed)
Patients wife Pamala Hurry calling regarding a root canal is supposed to be having , and is stressed about not seeing the cardiologist before he has the root canal   [phone]

## 2015-10-25 NOTE — Telephone Encounter (Signed)
Call placed to patient and patient wife Logan Cruz made aware.

## 2015-10-25 NOTE — Telephone Encounter (Signed)
If its not general anesthesia and just local than its okay, otherwise the root canal needs to be rescheduled

## 2015-10-27 ENCOUNTER — Other Ambulatory Visit: Payer: Medicare Other

## 2015-10-27 DIAGNOSIS — Z1211 Encounter for screening for malignant neoplasm of colon: Secondary | ICD-10-CM

## 2015-10-28 ENCOUNTER — Encounter: Payer: Self-pay | Admitting: Internal Medicine

## 2015-10-28 ENCOUNTER — Ambulatory Visit (INDEPENDENT_AMBULATORY_CARE_PROVIDER_SITE_OTHER): Payer: Medicare Other | Admitting: Internal Medicine

## 2015-10-28 VITALS — BP 144/84 | HR 58 | Ht 73.0 in | Wt 236.8 lb

## 2015-10-28 DIAGNOSIS — R9431 Abnormal electrocardiogram [ECG] [EKG]: Secondary | ICD-10-CM | POA: Diagnosis not present

## 2015-10-28 DIAGNOSIS — R002 Palpitations: Secondary | ICD-10-CM | POA: Diagnosis not present

## 2015-10-28 DIAGNOSIS — R079 Chest pain, unspecified: Secondary | ICD-10-CM | POA: Diagnosis not present

## 2015-10-28 DIAGNOSIS — R0602 Shortness of breath: Secondary | ICD-10-CM | POA: Diagnosis not present

## 2015-10-28 LAB — FECAL OCCULT BLOOD, IMMUNOCHEMICAL
Fecal Occult Blood: NEGATIVE
Fecal Occult Blood: NEGATIVE
Fecal Occult Blood: NEGATIVE

## 2015-10-28 NOTE — Patient Instructions (Signed)
Your physician has requested that you have en exercise stress myoview. For further information please visit [email or web address]. Please follow instruction sheet, as given.  Dr. Debara Pickett has ordered a 7 day event monitor for palpitations.  Please schedule a NURSE visit appointment to have this placed on the day of your stress test.   Your physician recommends that you schedule a follow-up appointment with Dr.Hilty after your stress test & monitor

## 2015-10-28 NOTE — Addendum Note (Signed)
Addended by: Pixie Casino on: 10/28/2015 01:08 PM   Modules accepted: Level of Service

## 2015-10-28 NOTE — Progress Notes (Signed)
OFFICE NOTE  Chief Complaint:  Palpitations, dyspnea  Primary Care Physician: Vic Blackbird, MD  HPI:  Logan Cruz is a 74 year old male is coming referred for evaluation of an irregular heartbeat. Recently he's been having some fluttering or awareness of his heart, but denies any chest pain. He does get some mild shortness of breath. Past surgical history significant for hypertension and GERD. He apparently has seen a cardiologist in the past when he used to live in [place]. There was about 5 years ago when he underwent stress testing at that time which was apparently negative. His mother did have a problem with arrhythmia but no significant coronary disease. He does have a daughter however who had a heart attack this spring. He tells me has a history of sleep apnea but underwent UPPP surgery and is essentially curative of his symptoms. His sleepiness score was only 3 today. He is physically active although does not exercise regularly. 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 EKG in the office today was abnormal indicating sinus bradycardia, minimal voltage criteria for LVH and inferolateral ST and T-wave changes.  PMHx:  Past Medical History  Diagnosis Date  . GERD (gastroesophageal reflux disease)   . Hypertension   . Arthritis     Past Surgical History  Procedure Laterality Date  . Vasectomy  1972    rh Negative incompatibility  . Rotator cuff repair Right 1990  . Tonsillectomy and adenoidectomy    . Carpal tunnel release      Both hands    FAMHx:  Family History  Problem Relation Age of Onset  . Arthritis Mother   . Hearing loss Mother   . Vision loss Mother   . Dementia Father   . Heart disease Daughter 67    MI spring 2016    SOCHx:   reports that he has never smoked. He has never used smokeless tobacco. He reports that he drinks about 0.6 oz of alcohol per week. He reports that he does not use illicit drugs.  ALLERGIES:   No Known Allergies  ROS: A comprehensive review of systems was negative except for: Respiratory: positive for dyspnea on exertion Cardiovascular: positive for irregular heart beat and palpitations  HOME MEDS: Current Outpatient Prescriptions  Medication Sig Dispense Refill  . amLODipine-benazepril (LOTREL) 5-20 MG per capsule Take 1 capsule by mouth daily. 90 capsule 3  . amoxicillin (AMOXIL) 500 MG capsule 500 mg. Take as directed for infection - Rx'ed by dentist    . aspirin 81 MG tablet Take 81 mg by mouth daily.    . celecoxib (CELEBREX) 100 MG capsule Take 1 capsule (100 mg total) by mouth 2 (two) times daily as needed. 180 capsule 3  . esomeprazole (NEXIUM) 40 MG capsule Take 1 capsule (40 mg total) by mouth daily at 12 noon. 90 capsule 3  . tamsulosin (FLOMAX) 0.4 MG CAPS capsule Take 1 capsule (0.4 mg total) by mouth daily. 90 capsule 3   No current facility-administered medications for this visit.    LABS/IMAGING: Results for orders placed or performed in visit on 10/27/15 (from the past 48 hour(s))  Fecal occult blood, imunochemical     Status: None   Collection Time: 10/27/15 12:59 PM  Result Value Ref Range   Fecal Occult Blood NEG Negative  Fecal occult blood, imunochemical     Status: None   Collection Time: 10/27/15 12:59 PM  Result Value Ref Range  Fecal Occult Blood NEG Negative  Fecal occult blood, imunochemical     Status: None   Collection Time: 10/27/15 12:59 PM  Result Value Ref Range   Fecal Occult Blood NEG Negative   No results found.  WEIGHTS: Wt Readings from Last 3 Encounters:  10/28/15 236 lb 12.8 oz (107.412 kg)  10/11/15 241 lb (109.317 kg)  09/08/15 238 lb (107.956 kg)    VITALS: BP 144/84 mmHg  Pulse 58  Ht 6\' 1"  (1.854 m)  Wt 236 lb 12.8 oz (107.412 kg)  BMI 31.25 kg/m2  EXAM: General appearance: alert and no distress Neck: no carotid bruit, no JVD and thyroid not enlarged, symmetric, no tenderness/mass/nodules Lungs: clear to  auscultation bilaterally Heart: regular rate and rhythm, S1, S2 normal, no murmur, click, rub or gallop Abdomen: soft, non-tender; bowel sounds normal; no masses,  no organomegaly Extremities: extremities normal, atraumatic, no cyanosis or edema Pulses: 2+ and symmetric Skin: Skin color, texture, turgor normal. No rashes or lesions Neurologic: Grossly normal PSych: Pleasant  EKG: Sinus bradycardia 58, minimum voltage criteria for LVH, ST and T-wave abnormalities inferiorly and laterally  ASSESSMENT: 1. Palpitations 2. Abnormal EKG, possibly ischemic 3. Mild dyspnea 4. Hypertension-controlled  PLAN: 1.   Mr. Taha has an abnormal EKG with some possibly ischemic changes. He's been describing what sound like palpitations recently. They occur several times a week and I would propose we place a monitor to try to identify them. I also recommend an exercise Myoview to evaluate for ischemia based on his abnormal EKG, palpitations and dyspnea. He has cardiovascular risk factors and given his age is at least at intermediate risk for coronary artery disease. Plan to see him back to discuss those results in a few weeks.  Thanks again for the kind referral.  Pixie Casino, MD, [facility] Attending Cardiologist [place] 10/28/2015, 1:02 PM

## 2015-11-04 ENCOUNTER — Telehealth (HOSPITAL_COMMUNITY): Payer: Self-pay

## 2015-11-04 NOTE — Telephone Encounter (Signed)
Encounter complete. 

## 2015-11-08 ENCOUNTER — Ambulatory Visit (INDEPENDENT_AMBULATORY_CARE_PROVIDER_SITE_OTHER): Payer: Medicare Other

## 2015-11-08 ENCOUNTER — Ambulatory Visit (HOSPITAL_COMMUNITY)
Admission: RE | Admit: 2015-11-08 | Discharge: 2015-11-08 | Disposition: A | Discharge status: Home / Self Care | Payer: Medicare Other | Source: Ambulatory Visit | Attending: Cardiovascular Disease | Admitting: Cardiovascular Disease

## 2015-11-08 DIAGNOSIS — R079 Chest pain, unspecified: Secondary | ICD-10-CM | POA: Diagnosis not present

## 2015-11-08 DIAGNOSIS — R0602 Shortness of breath: Secondary | ICD-10-CM | POA: Diagnosis not present

## 2015-11-08 DIAGNOSIS — R0609 Other forms of dyspnea: Secondary | ICD-10-CM | POA: Insufficient documentation

## 2015-11-08 DIAGNOSIS — R5383 Other fatigue: Secondary | ICD-10-CM | POA: Diagnosis not present

## 2015-11-08 DIAGNOSIS — R002 Palpitations: Secondary | ICD-10-CM

## 2015-11-08 DIAGNOSIS — Z8249 Family history of ischemic heart disease and other diseases of the circulatory system: Secondary | ICD-10-CM | POA: Insufficient documentation

## 2015-11-08 DIAGNOSIS — I10 Essential (primary) hypertension: Secondary | ICD-10-CM | POA: Insufficient documentation

## 2015-11-08 DIAGNOSIS — G4733 Obstructive sleep apnea (adult) (pediatric): Secondary | ICD-10-CM | POA: Diagnosis not present

## 2015-11-08 LAB — MYOCARDIAL PERFUSION IMAGING
CHL CUP NUCLEAR SSS: 2
CSEPPHR: 88 {beats}/min
LV dias vol: 130 mL
LVSYSVOL: 72 mL
NUC STRESS TID: 1.12
Rest HR: 58 {beats}/min
SDS: 1
SRS: 1

## 2015-11-08 MED ORDER — REGADENOSON 0.4 MG/5ML IV SOLN
0.4000 mg | Freq: Once | INTRAVENOUS | Status: AC
  Administered 2015-11-08: 0.4 mg via INTRAVENOUS

## 2015-11-08 MED ORDER — TECHNETIUM TC 99M SESTAMIBI GENERIC - CARDIOLITE
31.6000 | Freq: Once | INTRAVENOUS | Status: AC | PRN
  Administered 2015-11-08: 31.6 via INTRAVENOUS

## 2015-11-08 MED ORDER — TECHNETIUM TC 99M SESTAMIBI GENERIC - CARDIOLITE
11.0000 | Freq: Once | INTRAVENOUS | Status: AC | PRN
  Administered 2015-11-08: 11 via INTRAVENOUS

## 2015-11-08 NOTE — Patient Instructions (Signed)
Cardiac Event Monitoring °A cardiac event monitor is a small recording device used to help detect abnormal heart rhythms (arrhythmias). The monitor is used to record heart rhythm when noticeable symptoms such as the following occur: °· Fast heartbeats (palpitations), such as heart racing or fluttering. °· Dizziness. °· Fainting or light-headedness. °· Unexplained weakness. °The monitor is wired to two electrodes placed on your chest. Electrodes are flat, sticky disks that attach to your skin. The monitor can be worn for up to 30 days. You will wear the monitor at all times, except when bathing.  °HOW TO USE YOUR CARDIAC EVENT MONITOR °A technician will prepare your chest for the electrode placement. The technician will show you how to place the electrodes, how to work the monitor, and how to replace the batteries. Take time to practice using the monitor before you leave the office. Make sure you understand how to send the information from the monitor to your health care provider. This requires a telephone with a landline, not a cell phone. You need to: °· Wear your monitor at all times, except when you are in water: °¨ Do not get the monitor wet. °¨ Take the monitor off when bathing. Do not swim or use a hot tub with it on. °· Keep your skin clean. Do not put body lotion or moisturizer on your chest. °· Change the electrodes daily or any time they stop sticking to your skin. You might need to use tape to keep them on. °· It is possible that your skin under the electrodes could become irritated. To keep this from happening, try to put the electrodes in slightly different places on your chest. However, they must remain in the area under your left breast and in the upper right section of your chest. °· Make sure the monitor is safely clipped to your clothing or in a location close to your body that your health care provider recommends. °· Press the button to record when you feel symptoms of heart trouble, such as  dizziness, weakness, light-headedness, palpitations, thumping, shortness of breath, unexplained weakness, or a fluttering or racing heart. The monitor is always on and records what happened slightly before you pressed the button, so do not worry about being too late to get good information. °· Keep a diary of your activities, such as walking, doing chores, and taking medicine. It is especially important to note what you were doing when you pushed the button to record your symptoms. This will help your health care provider determine what might be contributing to your symptoms. The information stored in your monitor will be reviewed by your health care provider alongside your diary entries. °· Send the recorded information as recommended by your health care provider. It is important to understand that it will take some time for your health care provider to process the results. °· Change the batteries as recommended by your health care provider. °SEEK IMMEDIATE MEDICAL CARE IF:  °· You have chest pain. °· You have extreme difficulty breathing or shortness of breath. °· You develop a very fast heartbeat that persists. °· You develop dizziness that does not go away. °· You faint or constantly feel you are about to faint. °  °This information is not intended to replace advice given to you by your health care provider. Make sure you discuss any questions you have with your health care provider. °  °Document Released: 09/25/2008 Document Revised: 01/07/2015 Document Reviewed: 06/15/2013 °Elsevier Interactive Patient Education ©2016 [employer] ° °

## 2015-11-08 NOTE — Progress Notes (Unsigned)
Monitor placement (7-day) for palpitations/HR irregularities per Dr. Debara Pickett.

## 2015-11-09 ENCOUNTER — Other Ambulatory Visit: Payer: Self-pay | Admitting: *Deleted

## 2015-11-09 DIAGNOSIS — R0602 Shortness of breath: Secondary | ICD-10-CM

## 2015-11-09 DIAGNOSIS — R079 Chest pain, unspecified: Secondary | ICD-10-CM

## 2015-11-09 DIAGNOSIS — R931 Abnormal findings on diagnostic imaging of heart and coronary circulation: Secondary | ICD-10-CM

## 2015-11-09 DIAGNOSIS — R002 Palpitations: Secondary | ICD-10-CM

## 2015-11-15 ENCOUNTER — Other Ambulatory Visit (HOSPITAL_COMMUNITY): Payer: Medicare Other

## 2015-11-15 DIAGNOSIS — R0989 Other specified symptoms and signs involving the circulatory and respiratory systems: Secondary | ICD-10-CM

## 2015-11-18 ENCOUNTER — Telehealth: Payer: Self-pay | Admitting: Internal Medicine

## 2015-11-18 NOTE — Telephone Encounter (Signed)
Spoke with patient's wife about monitor results.

## 2015-11-18 NOTE — Telephone Encounter (Signed)
Pt's wife is returning the nurse's call in regards to some test results . Please f/u with pt  Thanks

## 2015-11-29 ENCOUNTER — Ambulatory Visit (INDEPENDENT_AMBULATORY_CARE_PROVIDER_SITE_OTHER): Payer: Medicare Other | Admitting: Internal Medicine

## 2015-11-29 ENCOUNTER — Encounter: Payer: Self-pay | Admitting: Internal Medicine

## 2015-11-29 VITALS — BP 144/100 | HR 66 | Ht 73.0 in | Wt 240.8 lb

## 2015-11-29 DIAGNOSIS — R002 Palpitations: Secondary | ICD-10-CM

## 2015-11-29 DIAGNOSIS — R9431 Abnormal electrocardiogram [ECG] [EKG]: Secondary | ICD-10-CM

## 2015-11-29 MED ORDER — METOPROLOL SUCCINATE ER 25 MG PO TB24: 12.5000 mg | ORAL_TABLET | Freq: Every day | ORAL | Status: DC

## 2015-11-29 NOTE — Patient Instructions (Signed)
Dr Debara Pickett has recommended making the following medication changes: START Metoprolol Succinate (Toprol XL) 25 mg tablets - take a half tablet by mouth daily.  >>A new prescription has been sent to your pharmacy electronically.  Please schedule the limited echocardiogram!!  Dr Debara Pickett recommends that you schedule a follow-up appointment in 1 month.  If you need a refill on your cardiac medications before your next appointment, please call your pharmacy.

## 2015-11-29 NOTE — Progress Notes (Signed)
OFFICE NOTE  Chief Complaint:  Follow-up nuclear stress test and monitor  Primary Care Physician: Vic Blackbird, MD  HPI:  Logan Cruz is a 74 year old male is coming referred for evaluation of an irregular heartbeat. Recently he's been having some fluttering or awareness of his heart, but denies any chest pain. He does get some mild shortness of breath. Past surgical history significant for hypertension and GERD. He apparently has seen a cardiologist in the past when he used to live in [place]. There was about 5 years ago when he underwent stress testing at that time which was apparently negative. His mother did have a problem with arrhythmia but no significant coronary disease. He does have a daughter however who had a heart attack this spring. He tells me has a history of sleep apnea but underwent UPPP surgery and is essentially curative of his symptoms. His sleepiness score was only 3 today. He is physically active although does not exercise regularly. 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 EKG in the office today was abnormal indicating sinus bradycardia, minimal voltage criteria for LVH and inferolateral ST and T-wave changes.  Logan Cruz returns today, accompanied by his son. He underwent monitoring and a nuclear stress test for palpitations and chest pain. The monitor showed that he is having PAC's, but no a-fib or other arrhythmias were noted. His nuclear stress test was abnormal, with an EF of 45%, but normal perfusion.  I discussed the findings with him today, including the possibility that the EF may be falsely low secondary to a gating abnormality. I recommend that we get an echocardiogram to assess for EF.  If it remains low, he may need cardiac catheterization. I suspect a possible cause of his cardiomyopathy may be hypertension. BP is still not well controlled today, with diastolic pressure of 123XX123.  PMHx:  Past Medical History  Diagnosis  Date  . GERD (gastroesophageal reflux disease)   . Hypertension   . Arthritis     Past Surgical History  Procedure Laterality Date  . Vasectomy  1972    rh Negative incompatibility  . Rotator cuff repair Right 1990  . Tonsillectomy and adenoidectomy    . Carpal tunnel release      Both hands    FAMHx:  Family History  Problem Relation Age of Onset  . Arthritis Mother   . Hearing loss Mother   . Vision loss Mother   . Dementia Father   . Other Mother     rhythm issue  . Heart disease Daughter 39    MI - spring 2016    SOCHx:   reports that he has never smoked. He has never used smokeless tobacco. He reports that he drinks about 0.6 oz of alcohol per week. He reports that he does not use illicit drugs.  ALLERGIES:  No Known Allergies  ROS: A comprehensive review of systems was negative except for: Respiratory: positive for dyspnea on exertion Cardiovascular: positive for irregular heart beat and palpitations  HOME MEDS: Current Outpatient Prescriptions  Medication Sig Dispense Refill  . amLODipine-benazepril (LOTREL) 5-20 MG per capsule Take 1 capsule by mouth daily. 90 capsule 3  . amoxicillin (AMOXIL) 500 MG capsule 500 mg. Take as directed for infection - Rx'ed by dentist    . aspirin 81 MG tablet Take 81 mg by mouth daily.    . celecoxib (CELEBREX) 100 MG capsule Take 1 capsule (100 mg total)  by mouth 2 (two) times daily as needed. 180 capsule 3  . esomeprazole (NEXIUM) 40 MG capsule Take 1 capsule (40 mg total) by mouth daily at 12 noon. 90 capsule 3  . tamsulosin (FLOMAX) 0.4 MG CAPS capsule Take 1 capsule (0.4 mg total) by mouth daily. 90 capsule 3  . metoprolol succinate (TOPROL XL) 25 MG 24 hr tablet Take 0.5 tablets (12.5 mg total) by mouth daily. 15 tablet 11   No current facility-administered medications for this visit.    LABS/IMAGING: No results found for this or any previous visit (from the past 48 hour(s)). No results found.  WEIGHTS: Wt  Readings from Last 3 Encounters:  11/29/15 240 lb 12.8 oz (109.226 kg)  11/08/15 236 lb (107.049 kg)  10/28/15 236 lb 12.8 oz (107.412 kg)    VITALS: BP 144/100 mmHg  Pulse 66  Ht 6\' 1"  (1.854 m)  Wt 240 lb 12.8 oz (109.226 kg)  BMI 31.78 kg/m2  SpO2 96%  EXAM: deferred  EKG: Deferred  ASSESSMENT: 1. Palpitations - PAC's 2. Abnormal EKG - EF 45%, low risk myoview without ischemia 3. Mild dyspnea 4. Hypertension-not at goal  PLAN: 1.   Logan Cruz has evidence for cardiomyopathy but no ischemia on his NST. I would like to get an echo to confirm or refute the EF findings. If the EF is still down, the possibilities are this could be ischemia or non-ischemic. He may need catheterization. He could also use better BP control. To treat possible CAD, cardiomyopathy and palpitations, I'm recommending he start on low dose Toprol Xl 12.5 mg daily. I'll review his echo and discuss the next steps with him when he returns.  Thanks again for the kind referral.  Pixie Casino, MD, [facility] Attending Cardiologist [place] 11/29/2015, 6:26 PM

## 2015-12-02 ENCOUNTER — Telehealth: Payer: Self-pay | Admitting: Internal Medicine

## 2015-12-02 NOTE — Telephone Encounter (Signed)
Close encounter 

## 2015-12-15 ENCOUNTER — Other Ambulatory Visit (HOSPITAL_COMMUNITY): Payer: Medicare Other

## 2015-12-16 ENCOUNTER — Other Ambulatory Visit: Payer: Self-pay | Admitting: Internal Medicine

## 2015-12-16 ENCOUNTER — Other Ambulatory Visit: Payer: Self-pay

## 2015-12-16 ENCOUNTER — Ambulatory Visit (HOSPITAL_COMMUNITY): Payer: Medicare Other | Attending: Cardiology

## 2015-12-16 DIAGNOSIS — R931 Abnormal findings on diagnostic imaging of heart and coronary circulation: Secondary | ICD-10-CM | POA: Insufficient documentation

## 2015-12-16 DIAGNOSIS — I5189 Other ill-defined heart diseases: Secondary | ICD-10-CM | POA: Insufficient documentation

## 2015-12-16 DIAGNOSIS — R0602 Shortness of breath: Secondary | ICD-10-CM | POA: Diagnosis not present

## 2015-12-16 DIAGNOSIS — R002 Palpitations: Secondary | ICD-10-CM | POA: Diagnosis not present

## 2015-12-16 DIAGNOSIS — I071 Rheumatic tricuspid insufficiency: Secondary | ICD-10-CM | POA: Diagnosis not present

## 2015-12-16 DIAGNOSIS — I77810 Thoracic aortic ectasia: Secondary | ICD-10-CM | POA: Diagnosis not present

## 2015-12-16 DIAGNOSIS — R079 Chest pain, unspecified: Secondary | ICD-10-CM

## 2015-12-16 DIAGNOSIS — I517 Cardiomegaly: Secondary | ICD-10-CM | POA: Diagnosis not present

## 2015-12-16 DIAGNOSIS — I10 Essential (primary) hypertension: Secondary | ICD-10-CM | POA: Insufficient documentation

## 2015-12-21 ENCOUNTER — Telehealth: Payer: Self-pay | Admitting: Internal Medicine

## 2015-12-21 MED ORDER — METOPROLOL SUCCINATE ER 25 MG PO TB24: 12.5000 mg | ORAL_TABLET | Freq: Every day | ORAL | Status: DC

## 2015-12-21 NOTE — Telephone Encounter (Signed)
METOPROLOL SUCC.---E-sent to [facility] 90 DAY SUPPLY X 3 WIFE  AWARE

## 2015-12-21 NOTE — Telephone Encounter (Signed)
°*  STAT* If patient is at the pharmacy, call can be transferred to refill team.   1. Which medications need to be refilled? (please list name of each medication and dose if known) Metoprolol Succinate   2. Which pharmacy/location (including street and city if local pharmacy) is medication to be sent to?[phone])  3. Do they need a 30 day or 90 day supply? [place]

## 2016-01-10 ENCOUNTER — Encounter: Payer: Self-pay | Admitting: Internal Medicine

## 2016-01-10 ENCOUNTER — Ambulatory Visit (INDEPENDENT_AMBULATORY_CARE_PROVIDER_SITE_OTHER): Payer: Medicare Other | Admitting: Internal Medicine

## 2016-01-10 VITALS — BP 140/80 | HR 64 | Ht 73.0 in | Wt 241.8 lb

## 2016-01-10 DIAGNOSIS — R0602 Shortness of breath: Secondary | ICD-10-CM

## 2016-01-10 DIAGNOSIS — I10 Essential (primary) hypertension: Secondary | ICD-10-CM | POA: Diagnosis not present

## 2016-01-10 DIAGNOSIS — R002 Palpitations: Secondary | ICD-10-CM

## 2016-01-10 NOTE — Progress Notes (Signed)
OFFICE NOTE  Chief Complaint:   Follow-up palpitations  Primary Care Physician: Vic Blackbird, MD  HPI:  Logan Cruz is a 75 year old male is coming referred for evaluation of an irregular heartbeat. Recently he's been having some fluttering or awareness of his heart, but denies any chest pain. He does get some mild shortness of breath. Past surgical history significant for hypertension and GERD. He apparently has seen a cardiologist in the past when he used to live in [place]. There was about 5 years ago when he underwent stress testing at that time which was apparently negative. His mother did have a problem with arrhythmia but no significant coronary disease. He does have a daughter however who had a heart attack this spring. He tells me has a history of sleep apnea but underwent UPPP surgery and is essentially curative of his symptoms. His sleepiness score was only 3 today. He is physically active although does not exercise regularly. 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 EKG in the office today was abnormal indicating sinus bradycardia, minimal voltage criteria for LVH and inferolateral ST and T-wave changes.  Logan Cruz returns today, accompanied by his son. He underwent monitoring and a nuclear stress test for palpitations and chest pain. The monitor showed that he is having PAC's, but no a-fib or other arrhythmias were noted. His nuclear stress test was abnormal, with an EF of 45%, but normal perfusion.  I discussed the findings with him today, including the possibility that the EF may be falsely low secondary to a gating abnormality. I recommend that we get an echocardiogram to assess for EF.  If it remains low, he may need cardiac catheterization. I suspect a possible cause of his cardiomyopathy may be hypertension. BP is still not well controlled today, with diastolic pressure of 123XX123.   I saw Logan Cruz back in the office today. He's been taking  his low-dose Toprol but does feel better in the fact that he's had no chest pain , mild shortness of breath and no further palpitations. He's not particularly active and could benefit from more exercise and weight loss. Blood pressure is well-controlled today.  His stress test revealed no ischemia however EF is reduced to 45%. I felt this might be related to gating abnormality and ordered echocardiogram which showed normal LV function and EF 55-60%.  PMHx:  Past Medical History  Diagnosis Date  . GERD (gastroesophageal reflux disease)   . Hypertension   . Arthritis     Past Surgical History  Procedure Laterality Date  . Vasectomy  1972    rh Negative incompatibility  . Rotator cuff repair Right 1990  . Tonsillectomy and adenoidectomy    . Carpal tunnel release      Both hands    FAMHx:  Family History  Problem Relation Age of Onset  . Arthritis Mother   . Hearing loss Mother   . Vision loss Mother   . Dementia Father   . Other Mother     rhythm issue  . Heart disease Daughter 47    MI - spring 2016    SOCHx:   reports that he has never smoked. He has never used smokeless tobacco. He reports that he drinks about 0.6 oz of alcohol per week. He reports that he does not use illicit drugs.  ALLERGIES:  No Known Allergies  ROS: A comprehensive review of systems was negative.  HOME MEDS: Current  Outpatient Prescriptions  Medication Sig Dispense Refill  . amLODipine-benazepril (LOTREL) 5-20 MG per capsule Take 1 capsule by mouth daily. 90 capsule 3  . amoxicillin (AMOXIL) 500 MG capsule 500 mg. Take as directed for infection - Rx'ed by dentist    . aspirin 81 MG tablet Take 81 mg by mouth daily.    . celecoxib (CELEBREX) 100 MG capsule Take 1 capsule (100 mg total) by mouth 2 (two) times daily as needed. 180 capsule 3  . esomeprazole (NEXIUM) 40 MG capsule Take 1 capsule (40 mg total) by mouth daily at 12 noon. 90 capsule 3  . latanoprost (XALATAN) 0.005 % ophthalmic  solution Place 1 drop into both eyes at bedtime.  1  . metoprolol succinate (TOPROL XL) 25 MG 24 hr tablet Take 0.5 tablets (12.5 mg total) by mouth daily. 45 tablet 3  . tamsulosin (FLOMAX) 0.4 MG CAPS capsule Take 1 capsule (0.4 mg total) by mouth daily. 90 capsule 3   No current facility-administered medications for this visit.    LABS/IMAGING: No results found for this or any previous visit (from the past 48 hour(s)). No results found.  WEIGHTS: Wt Readings from Last 3 Encounters:  01/10/16 241 lb 12.8 oz (109.68 kg)  11/29/15 240 lb 12.8 oz (109.226 kg)  11/08/15 236 lb (107.049 kg)    VITALS: BP 140/80 mmHg  Pulse 64  Ht 6\' 1"  (1.854 m)  Wt 241 lb 12.8 oz (109.68 kg)  BMI 31.91 kg/m2  EXAM: deferred  EKG: Deferred  ASSESSMENT: 1. Palpitations - resolved 2. Abnormal EKG - EF 45%, low risk myoview without ischemia -  Repeat LVEF by echo was 55-60% 3. Mild dyspnea 4. Hypertension-controlled  PLAN: 1.   Logan Cruz  Had a low risk Myoview with EF of 45% however subsequent echo showed normal LV function. He was having palpitations and was started on low-dose beta blocker which has resolved. His blood pressure is well-controlled. He denies any chest pain. I recommend continuing low-dose beta blocker and low-dose aspirin although there is no clear evidence of reversible ischemia on his stress test , he could very well have some coronary artery disease which we will medically  Manage at this point. Should he develop new symptoms, significant worsening shortness of breath or chest pain, he may be cardiac catheterization at some point.  Plan to see him back in 6 months.  Pixie Casino, MD, [facility] Attending Cardiologist [place] 01/10/2016, 11:48 AM

## 2016-01-10 NOTE — Patient Instructions (Signed)
Medication Instructions:  Your physician recommends that you continue on your current medications as directed. Please refer to the Current Medication list given to you today.   Labwork: none  Testing/Procedures: none  Follow-Up: Your physician wants you to follow-up in: 6 months with Dr. Hilty. You will receive a reminder letter in the mail two months in advance. If you don't receive a letter, please call our office to schedule the follow-up appointment.   Any Other Special Instructions Will Be Listed Below (If Applicable).     If you need a refill on your cardiac medications before your next appointment, please call your pharmacy.   

## 2016-02-18 ENCOUNTER — Other Ambulatory Visit: Payer: Self-pay | Admitting: Family Medicine

## 2016-02-20 NOTE — Telephone Encounter (Signed)
Refill appropriate and filled per protocol. 

## 2016-02-22 IMAGING — NM NM MISC PROCEDURE
6 series · 36 of 36 positions shown · non-contrast
Comparison: none

[Series 1: wbr rest · 6.40mm/px · 6 of 64 frames shown]
[frame 6/64]
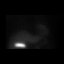
[frame 16/64]
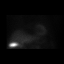
[frame 27/64]
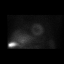
[frame 38/64]
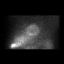
[frame 48/64]
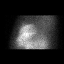
[frame 59/64]
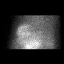

[Series 1: wbr_r-proj_st wbr rest · 6.40mm/px · 6 of 64 frames shown]
[frame 6/64]
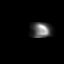
[frame 16/64]
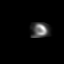
[frame 27/64]
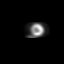
[frame 38/64]
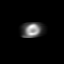
[frame 48/64]
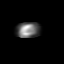
[frame 59/64]
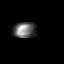

[Series 2: wbr stress-gsp · 6.40mm/px · 6 of 512 frames shown]
[frame 43/512]
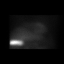
[frame 128/512]
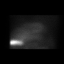
[frame 214/512]
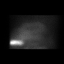
[frame 299/512]
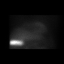
[frame 384/512]
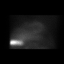
[frame 470/512]
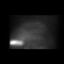

[Series 2: wbr_s-proj_st wbr stress-gsp · 6.40mm/px · 6 of 512 frames shown]
[frame 43/512]
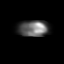
[frame 128/512]
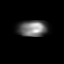
[frame 214/512]
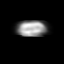
[frame 299/512]
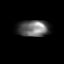
[frame 384/512]
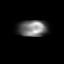
[frame 470/512]
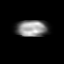

[Series 3: wbr_s-proj_st wbr stress-sum-em · 6.40mm/px · 6 of 64 frames shown]
[frame 6/64]
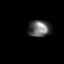
[frame 16/64]
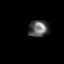
[frame 27/64]
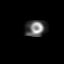
[frame 38/64]
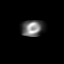
[frame 48/64]
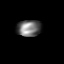
[frame 59/64]
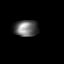

[Series 3: wbr stress-sum-em · 6.40mm/px · 6 of 64 frames shown]
[frame 6/64]
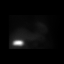
[frame 16/64]
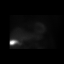
[frame 27/64]
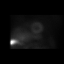
[frame 38/64]
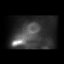
[frame 48/64]
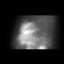
[frame 59/64]
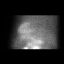

[36 of 36 positions shown; findings below may reference images not displayed]

Canned report from images found in remote index.

Refer to host system for actual result text.

## 2016-03-25 ENCOUNTER — Other Ambulatory Visit: Payer: Self-pay | Admitting: Family Medicine

## 2016-03-26 NOTE — Telephone Encounter (Signed)
Refill appropriate and filled per protocol. 

## 2016-03-27 ENCOUNTER — Encounter: Payer: Self-pay | Admitting: Family Medicine

## 2016-03-27 ENCOUNTER — Ambulatory Visit (INDEPENDENT_AMBULATORY_CARE_PROVIDER_SITE_OTHER): Payer: Medicare Other | Admitting: Family Medicine

## 2016-03-27 VITALS — BP 132/70 | HR 78 | Temp 98.1°F | Resp 18 | Ht 73.0 in | Wt 238.0 lb

## 2016-03-27 DIAGNOSIS — H6123 Impacted cerumen, bilateral: Secondary | ICD-10-CM | POA: Diagnosis not present

## 2016-03-27 DIAGNOSIS — N400 Enlarged prostate without lower urinary tract symptoms: Secondary | ICD-10-CM

## 2016-03-27 MED ORDER — DOXAZOSIN MESYLATE 2 MG PO TABS: ORAL_TABLET | ORAL | Status: DC

## 2016-03-27 NOTE — Patient Instructions (Signed)
Stop the flomax Try the Cardura 2mg  at bedtime for your prostate If this helps we can switch to your mail order F/U as previous

## 2016-03-27 NOTE — Progress Notes (Signed)
Patient ID: Logan Cruz, male   DOB: 1941-09-11, 75 y.o.   MRN: [id number]   Subjective:    Patient ID: Logan Cruz, male    DOB: February 19, 1941, 75 y.o.   MRN: [id number]  Patient presents for [facility] Impaction Patient here for stuffiness and bilateral ears feel like wax buildup as previous. He would like his ears cleaned out. On review of his medications he said that he does not think the Flomax is working as well he would like to try something different. He denies any dysuria at this is typical BPH symptoms with dribbling and not getting all his urine output    Review Of Systems: per above   GEN- denies fatigue, fever, weight loss,weakness, recent illness HEENT- denies eye drainage, change in vision, nasal discharge, CVS- denies chest pain, palpitations RESP- denies SOB, cough, wheeze ABD- denies N/V, change in stools, abd pain GU- denies dysuria, hematuria, dribbling, incontinence MSK- denies joint pain, muscle aches, injury Neuro- denies headache, dizziness, syncope, seizure activity       Objective:    BP 132/70 mmHg  Pulse 78  Temp(Src) 98.1 F (36.7 C) (Oral)  Resp 18  Ht 6\' 1"  (1.854 m)  Wt 238 lb (107.956 kg)  BMI 31.41 kg/m2 GEN- NAD, alert and oriented x3 HEENT- PERRL, EOMI, non injected sclera, pink conjunctiva, MMM, oropharynx clear, Canal impacted with wax bilat, s/o removal TM clear  Neck- Supple, no thyromegaly        Assessment & Plan:      Problem List Items Addressed This Visit    BPH (benign prostatic hypertrophy)    Trial of doxazosin 2 mg at bedtime he will hold the Flomax       Other Visit Diagnoses    Cerumen impaction, bilateral    -  Primary    Irrigation performed at bedside recurrent problem for patient       Note: This dictation was prepared with Dragon dictation along with smaller phrase technology. Any transcriptional errors that result from this process are unintentional.

## 2016-03-27 NOTE — Assessment & Plan Note (Signed)
Trial of doxazosin 2 mg at bedtime he will hold the Flomax

## 2016-05-01 ENCOUNTER — Ambulatory Visit (INDEPENDENT_AMBULATORY_CARE_PROVIDER_SITE_OTHER): Payer: Medicare Other | Admitting: Family Medicine

## 2016-05-01 VITALS — BP 132/88 | HR 80 | Temp 98.1°F | Resp 16 | Ht 73.0 in | Wt 240.0 lb

## 2016-05-01 DIAGNOSIS — R4189 Other symptoms and signs involving cognitive functions and awareness: Secondary | ICD-10-CM | POA: Diagnosis not present

## 2016-05-01 DIAGNOSIS — R7303 Prediabetes: Secondary | ICD-10-CM | POA: Diagnosis not present

## 2016-05-01 DIAGNOSIS — I10 Essential (primary) hypertension: Secondary | ICD-10-CM | POA: Diagnosis not present

## 2016-05-01 DIAGNOSIS — N400 Enlarged prostate without lower urinary tract symptoms: Secondary | ICD-10-CM | POA: Diagnosis not present

## 2016-05-01 LAB — COMPREHENSIVE METABOLIC PANEL
ALK PHOS: 102 U/L (ref 40–115)
ALT: 44 U/L (ref 9–46)
AST: 39 U/L — ABNORMAL HIGH (ref 10–35)
Albumin: 4 g/dL (ref 3.6–5.1)
BUN: 19 mg/dL (ref 7–25)
CHLORIDE: 105 mmol/L (ref 98–110)
CO2: 22 mmol/L (ref 20–31)
CREATININE: 0.96 mg/dL (ref 0.70–1.18)
Calcium: 9 mg/dL (ref 8.6–10.3)
Glucose, Bld: 82 mg/dL (ref 70–99)
Potassium: 4.3 mmol/L (ref 3.5–5.3)
SODIUM: 139 mmol/L (ref 135–146)
TOTAL PROTEIN: 7.1 g/dL (ref 6.1–8.1)
Total Bilirubin: 0.6 mg/dL (ref 0.2–1.2)

## 2016-05-01 LAB — CBC WITH DIFFERENTIAL/PLATELET
BASOS ABS: 0 {cells}/uL (ref 0–200)
Basophils Relative: 0 %
EOS ABS: 52 {cells}/uL (ref 15–500)
Eosinophils Relative: 1 %
HCT: 45.5 % (ref 38.5–50.0)
Hemoglobin: 15.2 g/dL (ref 13.0–17.0)
LYMPHS PCT: 30 %
Lymphs Abs: 1560 cells/uL (ref 850–3900)
MCH: 28.6 pg (ref 27.0–33.0)
MCHC: 33.4 g/dL (ref 32.0–36.0)
MCV: 85.7 fL (ref 80.0–100.0)
MONOS PCT: 10 %
MPV: 10.2 fL (ref 7.5–12.5)
Monocytes Absolute: 520 cells/uL (ref 200–950)
NEUTROS PCT: 59 %
Neutro Abs: 3068 cells/uL (ref 1500–7800)
PLATELETS: 162 10*3/uL (ref 140–400)
RBC: 5.31 MIL/uL (ref 4.20–5.80)
RDW: 14.3 % (ref 11.0–15.0)
WBC: 5.2 10*3/uL (ref 3.8–10.8)

## 2016-05-01 LAB — HEMOGLOBIN A1C
HEMOGLOBIN A1C: 6.3 % — AB (ref <5.7)
Mean Plasma Glucose: 134 mg/dL

## 2016-05-01 LAB — TSH: TSH: 4.15 mIU/L (ref 0.40–4.50)

## 2016-05-01 NOTE — Patient Instructions (Signed)
MRI OF BRAIN to be done We will call with lab results F/U pending results

## 2016-05-02 ENCOUNTER — Other Ambulatory Visit: Payer: Self-pay | Admitting: Family Medicine

## 2016-05-02 ENCOUNTER — Encounter: Payer: Self-pay | Admitting: Family Medicine

## 2016-05-02 DIAGNOSIS — R4189 Other symptoms and signs involving cognitive functions and awareness: Secondary | ICD-10-CM

## 2016-05-02 LAB — RPR

## 2016-05-02 MED ORDER — TAMSULOSIN HCL 0.4 MG PO CAPS: 0.4000 mg | ORAL_CAPSULE | Freq: Every day | ORAL | Status: DC

## 2016-05-02 NOTE — Assessment & Plan Note (Signed)
Restart Flomax 
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
## 2016-05-04 ENCOUNTER — Other Ambulatory Visit: Payer: Self-pay | Admitting: Family Medicine

## 2016-05-04 MED ORDER — TAMSULOSIN HCL 0.4 MG PO CAPS: 0.4000 mg | ORAL_CAPSULE | Freq: Every day | ORAL | Status: DC

## 2016-05-11 ENCOUNTER — Ambulatory Visit
Admission: RE | Admit: 2016-05-11 | Discharge: 2016-05-11 | Disposition: A | Discharge status: Home / Self Care | Payer: Medicare Other | Source: Ambulatory Visit | Attending: Family Medicine | Admitting: Family Medicine

## 2016-05-11 DIAGNOSIS — R4189 Other symptoms and signs involving cognitive functions and awareness: Secondary | ICD-10-CM

## 2016-05-16 ENCOUNTER — Ambulatory Visit (INDEPENDENT_AMBULATORY_CARE_PROVIDER_SITE_OTHER): Payer: Medicare Other | Admitting: Family Medicine

## 2016-05-16 ENCOUNTER — Encounter: Payer: Self-pay | Admitting: Family Medicine

## 2016-05-16 VITALS — BP 138/82 | HR 78 | Temp 98.6°F | Resp 14 | Ht 73.0 in | Wt 240.0 lb

## 2016-05-16 DIAGNOSIS — D181 Lymphangioma, any site: Secondary | ICD-10-CM | POA: Diagnosis not present

## 2016-05-16 DIAGNOSIS — G9608 Other cranial cerebrospinal fluid leak: Secondary | ICD-10-CM

## 2016-05-16 DIAGNOSIS — G960 Cerebrospinal fluid leak: Principal | ICD-10-CM

## 2016-05-16 DIAGNOSIS — F0390 Unspecified dementia without behavioral disturbance: Secondary | ICD-10-CM

## 2016-05-16 NOTE — Assessment & Plan Note (Signed)
Per above neurology referral Plan to start Aricept if nothing else of concern by neurologist
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
## 2016-05-16 NOTE — Patient Instructions (Addendum)
Referral to neurology for evaluation  F/u as previous

## 2016-05-17 ENCOUNTER — Other Ambulatory Visit: Payer: Self-pay | Admitting: *Deleted

## 2016-05-17 MED ORDER — TAMSULOSIN HCL 0.4 MG PO CAPS: 0.4000 mg | ORAL_CAPSULE | Freq: Every day | ORAL | Status: AC

## 2016-05-22 ENCOUNTER — Other Ambulatory Visit: Payer: Self-pay | Admitting: Family Medicine

## 2016-05-22 NOTE — Telephone Encounter (Signed)
Medication refilled per protocol. 

## 2016-06-18 ENCOUNTER — Encounter: Payer: Self-pay | Admitting: Neurology

## 2016-06-18 ENCOUNTER — Ambulatory Visit (INDEPENDENT_AMBULATORY_CARE_PROVIDER_SITE_OTHER): Payer: Medicare Other | Admitting: Neurology

## 2016-06-18 VITALS — BP 140/83 | HR 59 | Ht 73.0 in | Wt 240.2 lb

## 2016-06-18 DIAGNOSIS — R413 Other amnesia: Secondary | ICD-10-CM | POA: Diagnosis not present

## 2016-06-18 DIAGNOSIS — G3184 Mild cognitive impairment, so stated: Secondary | ICD-10-CM

## 2016-06-18 DIAGNOSIS — E538 Deficiency of other specified B group vitamins: Secondary | ICD-10-CM | POA: Diagnosis not present

## 2016-06-18 DIAGNOSIS — R4701 Aphasia: Secondary | ICD-10-CM

## 2016-06-18 MED ORDER — DONEPEZIL HCL 10 MG PO TABS: 10.0000 mg | ORAL_TABLET | Freq: Every day | ORAL | Status: AC

## 2016-06-18 NOTE — Progress Notes (Signed)
[facility]    Provider:  Dr Jaynee Eagles Referring Provider: Alycia Rossetti, MD Primary Care Physician:  Vic Blackbird, MD  CC:  Abnormal MRI of the brain, memory loss  HPI:  Logan Cruz is a 75 y.o. male here as a referral from Dr. Buelah Manis for memory loss, abnormal MRI of the brain. Memory problems started several years ago. Worsening, progressive. Forget where he put things, forgets what he needs to do. Wife here are provides more that 50% of information. He can't always rememebr things, she has to tell him multiple times, he forgets what she told him. He can't find his words, he knows what he wants to say but finds it difficult in coming out, he will start to talk and he has to stop and think about things. He understands what people say to him, just difficulty expressing himself, he also has hearing difficulty which could contribute. He had sleep evaluation and he had surgery with tonsil and adenoids out, he snores, very fatigued during the day. Wife has also noticed he loses his temper more eaily, frustrated especially if she tells him to do something. No depression. He gets frustrated with himself. Memories are clearer in the past. Father with dementia, alzheimer's.   Reviewed notes, labs and imaging from outside physicians, which showed:  Personally reviewed MRI of the brain and agree with the following:  There is moderate global cerebral atrophy. Prominent CSF spaces overlying both cerebral convexities are felt to predominantly be due to atrophy, however asymmetric CSF overlying the right frontal lobe measures up to 9 mm in thickness and likely reflects a small subdural hygroma with slight frontal gyral flattening. Scattered foci of T2 hyperintensity in the cerebral white matter bilaterally are nonspecific but compatible with mild chronic small vessel ischemic disease.  Orbits are unremarkable. Paranasal sinuses and mastoid air cells are clear. Major  intracranial vascular flow voids are preserved. Calvarial bone marrow signal is unremarkable.  IMPRESSION: 1. No acute intracranial abnormality. 2. 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 3. Small right frontal subdural hygroma.  Reviewed lab values, TSH, RPR normal.    Review of Systems: Patient complains of symptoms per HPI as well as the following symptoms: fatigue, snoring, hearing loss, ringing in ears, memory loss, sleepiness, snoring, decreased energy, not enough energy. Pertinent negatives per HPI. All others negative.   Social History   Social History  . Marital Status: Married    Spouse Name: Pamala Hurry  . Number of Children: 3  . Years of Education: 12   Occupational History  . Retired Animator    Social History Main Topics  . Smoking status: Never Smoker   . Smokeless tobacco: Never Used  . Alcohol Use: 0.6 oz/week    1 Cans of beer per week     Comment: 1-2 drinks per week)  . Drug Use: No  . Sexual Activity: Yes    Birth Control/ Protection: Surgical   Other Topics Concern  . Not on file   Social History Narrative   Lives with spouse   Caffeine use: none reported   Epworth Sleepiness Scale = 3 (1028/2016)    Family History  Problem Relation Age of Onset  . Arthritis Mother   . Hearing loss Mother   . Vision loss Mother   . Dementia Father   . Other Mother     rhythm issue  . Heart disease Daughter 58    MI - spring 2016  .  Glaucoma Mother     Past Medical History  Diagnosis Date  . GERD (gastroesophageal reflux disease)   . Hypertension   . Arthritis     Past Surgical History  Procedure Laterality Date  . Vasectomy  1972    rh Negative incompatibility  . Rotator cuff repair Right 1990  . Tonsillectomy and adenoidectomy  1980's  . Carpal tunnel release Bilateral 1994    Current Outpatient Prescriptions  Medication Sig Dispense Refill  . amLODipine-benazepril (LOTREL) 5-20 MG capsule TAKE 1 CAPSULE  DAILY 90 capsule 3  . aspirin 81 MG tablet Take 81 mg by mouth daily.    . celecoxib (CELEBREX) 100 MG capsule TAKE 1 CAPSULE TWICE DAILY AS NEEDED (DOSE CHANGE) 180 capsule 3  . esomeprazole (NEXIUM) 40 MG capsule TAKE 1 CAPSULE DAILY AT 12 NOON 90 capsule 3  . latanoprost (XALATAN) 0.005 % ophthalmic solution Place 1 drop into both eyes at bedtime.  1  . metoprolol succinate (TOPROL XL) 25 MG 24 hr tablet Take 0.5 tablets (12.5 mg total) by mouth daily. 45 tablet 3  . tadalafil (CIALIS) 20 MG tablet Take 20 mg by mouth as needed for erectile dysfunction.     . tamsulosin (FLOMAX) 0.4 MG CAPS capsule Take 1 capsule (0.4 mg total) by mouth daily. 90 capsule 3  . donepezil (ARICEPT) 10 MG tablet Take 1 tablet (10 mg total) by mouth at bedtime. Start with 1/2 a pill. Increase to a whole pill in 2 weeks. 30 tablet 12   No current facility-administered medications for this visit.    Allergies as of 06/18/2016  . (No Known Allergies)    Vitals: BP 140/83 mmHg  Pulse 59  Ht 6\' 1"  (1.854 m)  Wt 240 lb 3.2 oz (108.954 kg)  BMI 31.70 kg/m2 Last Weight:  Wt Readings from Last 1 Encounters:  06/18/16 240 lb 3.2 oz (108.954 kg)   Last Height:   Ht Readings from Last 1 Encounters:  06/18/16 6\' 1"  (1.854 m)    Physical exam: Exam: Gen: NAD, conversant, well nourised, obese, well groomed                     CV: RRR, no MRG. No Carotid Bruits. No peripheral edema, warm, nontender Eyes: Conjunctivae clear without exudates or hemorrhage  Neuro: Detailed Neurologic Exam  Speech:    Speech is normal; fluent and spontaneous with normal comprehension.  Cognition:    The patient is oriented to person, place, and time;     recent and remote memory intact;     language fluent;     normal attention, concentration,     fund of knowledge  MMSE - Mini Mental State Exam 06/18/2016  Orientation to time 4  Orientation to Place 4  Registration 3  Attention/ Calculation 0  Recall 2  Language-  name 2 objects 2  Language- repeat 1  Language- follow 3 step command 3  Language- read & follow direction 1  Write a sentence 0  Copy design 1  Total score 21   Cranial Nerves:    The pupils are equal, round, and reactive to light. The fundi are normal and spontaneous venous pulsations are present. Visual fields are full to finger confrontation. Extraocular movements are intact. Trigeminal sensation is intact and the muscles of mastication are normal. The face is symmetric. The palate elevates in the midline. Hearing impaired. Voice is normal. Shoulder shrug is normal. The tongue has normal motion without fasciculations.  Coordination:    Normal finger to nose and heel to shin. Normal rapid alternating movements.   Gait:    Heel-toe and tandem gait are normal.   Motor Observation:    No asymmetry, no atrophy, and no involuntary movements noted. Tone:    Normal muscle tone.    Posture:    Posture is normal. normal erect    Strength:    Strength is V/V in the upper and lower limbs.      Sensation: intact to LT     Reflex Exam:  DTR's:    Deep tendon reflexes in the upper and lower extremities are normal bilaterally.   Toes:    The toes are downgoing bilaterally.   Clonus:    Clonus is absent.      Assessment/Plan:   75 y.o. male here as a referral from Dr. Buelah Manis for memory loss, abnormal MRI of the brain. Memory problems started several years ago. 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 Hygroma could be from atrophy or previous head trauma. Reviewed images with patient and wife. Start Aricept 5mg  then increase to 10mg  daily. Discussed side effects as documented in AVS. Neurocognitive testing - declined.  Sleep apnea - ESS 1 no suggestion of OSA Speech therapy - will refer Poor sleep hygiene, discussed not to fall asleep in chair, Sleep in bedroom, no animals in bed,  Keep dark and cool keep regular sleep hours. Follow up  4-6 months  Sarina Ill, MD  [facility] [address] [place], [place] [address]  Phone [phone] Fax [phone]

## 2016-06-18 NOTE — Patient Instructions (Addendum)
Remember to drink plenty of fluid, eat healthy meals and do not skip any meals. Try to eat protein with a every meal and eat a healthy snack such as fruit or nuts in between meals. Try to keep a regular sleep-wake schedule and try to exercise daily, particularly in the form of walking, 20-30 minutes a day, if you can.   As far as your medications are concerned, I would like to suggest: Aricept 10mg . Start with 1/2 pill and increase to a whole pill as tolerated.  As far as diagnostic testing: Labs, speech therapy  I would like to see you back in 4-6 months, sooner if we need to. Please call us with any interim questions, concerns, problems, updates or refill requests.    Our phone number is [phone]. We also have an after hours call service for urgent matters and there is a physician on-call for urgent questions. For any emergencies you know to call 911 or go to the nearest emergency room  Donepezil tablets What is this medicine? DONEPEZIL (doe NEP e zil) is used to treat mild to moderate dementia caused by Alzheimer's disease. This medicine may be used for other purposes; ask your health care provider or pharmacist if you have questions. What should I tell my health care provider before I take this medicine? They need to know if you have any of these conditions: -asthma or other lung disease -difficulty passing urine -head injury -heart disease -history of irregular heartbeat -liver disease -seizures (convulsions) -stomach or intestinal disease, ulcers or stomach bleeding -an unusual or allergic reaction to donepezil, other medicines, foods, dyes, or preservatives -pregnant or trying to get pregnant -breast-feeding How should I use this medicine? Take this medicine by mouth with a glass of water. Follow the directions on the prescription label. You may take this medicine with or without food. Take this medicine at regular intervals. This medicine is usually taken before bedtime. Do  not take it more often than directed. Continue to take your medicine even if you feel better. Do not stop taking except on your doctor's advice. If you are taking the 23 mg donepezil tablet, swallow it whole; do not cut, crush, or chew it. Talk to your pediatrician regarding the use of this medicine in children. Special care may be needed. Overdosage: If you think you have taken too much of this medicine contact a poison control center or emergency room at once. NOTE: This medicine is only for you. Do not share this medicine with others. What if I miss a dose? If you miss a dose, take it as soon as you can. If it is almost time for your next dose, take only that dose, do not take double or extra doses. What may interact with this medicine? Do not take this medicine with any of the following medications: -certain medicines for fungal infections like itraconazole, fluconazole, posaconazole, and voriconazole -cisapride -dextromethorphan; quinidine -dofetilide -dronedarone -pimozide -quinidine -thioridazine -ziprasidone This medicine may also interact with the following medications: -antihistamines for allergy, cough and cold -atropine -bethanechol -carbamazepine -certain medicines for bladder problems like oxybutynin, tolterodine -certain medicines for Parkinson's disease like benztropine, trihexyphenidyl -certain medicines for stomach problems like dicyclomine, hyoscyamine -certain medicines for travel sickness like scopolamine -dexamethasone -ipratropium -NSAIDs, medicines for pain and inflammation, like ibuprofen or naproxen -other medicines for Alzheimer's disease -other medicines that prolong the QT interval (cause an abnormal heart rhythm) -phenobarbital -phenytoin -rifampin, rifabutin or rifapentine This list may not describe all possible interactions. Give your health  care provider a list of all the medicines, herbs, non-prescription drugs, or dietary supplements you use. Also  tell them if you smoke, drink alcohol, or use illegal drugs. Some items may interact with your medicine. What should I watch for while using this medicine? Visit your doctor or health care professional for regular checks on your progress. Check with your doctor or health care professional if your symptoms do not get better or if they get worse. You may get drowsy or dizzy. Do not drive, use machinery, or do anything that needs mental alertness until you know how this drug affects you. What side effects may I notice from receiving this medicine? Side effects that you should report to your doctor or health care professional as soon as possible: -allergic reactions like skin rash, itching or hives, swelling of the face, lips, or tongue -changes in vision -feeling faint or lightheaded, falls -problems with balance -redness, blistering, peeling or loosening of the skin, including inside the mouth -slow heartbeat, or palpitations -stomach pain -unusual bleeding or bruising, red or purple spots on the skin -vomiting -weight loss Side effects that usually do not require medical attention (report to your doctor or health care professional if they continue or are bothersome): -diarrhea, especially when starting treatment -headache -indigestion or heartburn -loss of appetite -muscle cramps -nausea This list may not describe all possible side effects. Call your doctor for medical advice about side effects. You may report side effects to FDA at 1-800-FDA-1088. Where should I keep my medicine? Keep out of reach of children. Store at room temperature between 15 and 30 degrees C (59 and 86 degrees F). Throw away any unused medicine after the expiration date. NOTE: This sheet is a summary. It may not cover all possible information. If you have questions about this medicine, talk to your doctor, pharmacist, or health care provider.    2016, Elsevier/Gold Standard. (2014-07-29 07:51:52)

## 2016-06-19 DIAGNOSIS — G3184 Mild cognitive impairment, so stated: Secondary | ICD-10-CM | POA: Insufficient documentation

## 2016-06-19 NOTE — Addendum Note (Signed)
Addended by: Sarina Ill B on: 06/19/2016 09:13 PM   Modules accepted: Level of Service

## 2016-06-22 LAB — B12 AND FOLATE PANEL: VITAMIN B 12: 398 pg/mL (ref 211–946)

## 2016-06-22 LAB — METHYLMALONIC ACID, SERUM: Methylmalonic Acid: 147 nmol/L (ref 0–378)

## 2016-06-25 ENCOUNTER — Telehealth: Payer: Self-pay | Admitting: *Deleted

## 2016-06-25 NOTE — Telephone Encounter (Signed)
-----   Message from Melvenia Beam, MD sent at 06/23/2016  8:32 AM EDT ----- Labs normal thanks

## 2016-06-25 NOTE — Telephone Encounter (Signed)
Called and spoke to wife, Pamala Hurry about normal labs per Dr Jaynee Eagles. She verbalized understanding.

## 2016-07-17 ENCOUNTER — Ambulatory Visit: Payer: Medicare Other | Attending: Neurology | Admitting: Speech Pathology

## 2016-07-17 DIAGNOSIS — R4701 Aphasia: Secondary | ICD-10-CM | POA: Diagnosis present

## 2016-07-17 NOTE — Patient Instructions (Signed)
Tips for Talking with People who have Aphasia  . Say one thing at a time . Don't  rush - slow down, be patient . Reduce background noise . Relax - be natural . Use pen and paper . Write down key words . Draw diagrams or pictures . Don't pretend you understand . Ask what helps . Recap - check you both understand   Describing words  What group does it belong to?  What do I use it for?  Where can I find it?  What does it LOOK like?  What other words go with it?  What is the 1st sound of the word?  Many Ways to Communicate  Describe it Write it Draw it Gesture it Use related words  There's an App for that: Family Feud, Stop-fun categories, What if, Conversation TherAPPy  Constant Therapy - free trial   Talk face to face, in quiet environment - mute the TV, walk away from noisy appliances  Get full attention before engaging in conversation or giving directions  Provided by: Barnabas Lister [place], [phone]

## 2016-07-17 NOTE — Therapy (Signed)
[facility] [address] [place], [place], [address] Phone: [phone]   Fax:  [phone]  Speech Language Pathology Evaluation  Patient Details  Name: Logan Cruz" Logan Halden Masias MRN: [id number] Date of Birth: Oct 03, 1941 Referring Provider: Dr. Sarina Ill  Encounter Date: 07/17/2016      End of Session - 07/17/16 1240    Visit Number 1   Number of Visits 17   Date for SLP Re-Evaluation 09/11/16   SLP Start Time 0932   SLP Stop Time  1017   SLP Time Calculation (min) 45 min   Activity Tolerance Patient tolerated treatment well      Past Medical History  Diagnosis Date  . GERD (gastroesophageal reflux disease)   . Hypertension   . Arthritis     Past Surgical History  Procedure Laterality Date  . Vasectomy  1972    rh Negative incompatibility  . Rotator cuff repair Right 1990  . Tonsillectomy and adenoidectomy  1980's  . Carpal tunnel release Bilateral 1994    There were no vitals filed for this visit.      Subjective Assessment - 07/17/16 0939    Subjective "My speech isn't very good"   Patient is accompained by: Family member   Special Tests spouse Jolayne Haines            SLP Evaluation [facility] - 07/17/16 0940    SLP Visit Information   SLP Received On 07/17/16   Referring Provider Dr. Sarina Ill   Onset Date MCI/dementia diagnosed 04/2016, however spouse states sx for several years   Medical Diagnosis MCI, aphasia   Subjective   Subjective "my speech isn't very good"   Patient/Family Stated Goal "To get better at talking"   General Information   HPI Logan "Jaci Cruz" Logan Cruz is a 75 y.o. male here as a referral from neurologist for memory loss, abnormal MRI of the brain. Memory problems started several years ago. Worsening, progressive.  Wife here are provides more that 50% of information. He can't always rememebr things, she has to tell him multiple times, he forgets what she told him. He  can't find his words, he knows what he wants to say but finds it difficult in coming out, he will start to talk and he has to stop and think about things. He understands what people say to him, just difficulty expressing himself, he also has hearing difficulty which could contribute   Mobility Status walks independently, no falls   Prior Functional Status   Cognitive/Linguistic Baseline Baseline deficits   Type of Home House    Lives With Spouse   Available Support Family   Vocation Retired   Associate Professor   Overall Cognitive Status History of cognitive impairments - at baseline   Memory Impaired   Memory Impairment Decreased recall of new information   Awareness Appears intact   Auditory Comprehension   Overall Auditory Comprehension Appears within functional limits for tasks assessed   Conversation Simple   Overall Auditory Comprehension Comments Bilateral ITE hearing aids   Visual Recognition/Discrimination   Discrimination Not tested   Reading Comprehension   Reading Status Not tested  Pt with h/o of double vision and 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   Expression   Primary Mode of Expression Verbal   Verbal Expression   Overall Verbal Expression Impaired   Initiation No impairment   Automatic Speech --  WFL   Naming Impairment   Responsive 76-100% accurate   Confrontation 75-100% accurate  Convergent 50-74% accurate   Divergent 75-100% accurate   Verbal Errors Semantic paraphasias;Phonemic paraphasias;Aware of errors   Pragmatics No impairment   Effective Techniques Phonemic cues;Articulatory cues;Sentence completion   Oral Motor/Sensory Function   Overall Oral Motor/Sensory Function Appears within functional limits for tasks assessed   Motor Speech   Overall Motor Speech Appears within functional limits for tasks assessed   Standardized Assessments   Standardized Assessments  --  42/60 - borderline below normal with SD+- 6                         SLP Education -  07/17/16 1240    Education provided Yes   Education Details goals, rationale for therapy, talk in quiet environement, get his full attention before speaking, compensations for aphasia   Person(s) Educated Patient;Spouse   Methods Explanation;Demonstration;Verbal cues;Handout   Comprehension Verbal cues required;Need further instruction          SLP Short Term Goals - 07/17/16 1254    SLP SHORT TERM GOAL #1   Title Pt will perform moderately complex naming tasks with 85% accuracy and rare min A   Time 4   Period Weeks   Status New   SLP SHORT TERM GOAL #2   Title Pt will utlize compensations for aphasia during structured tasks with occasiona min A   Time 4   Period Weeks   Status New   SLP SHORT TERM GOAL #3   Title Pt will comprehend mildly complex conversation in quiet environment with 95% accuracy and 1-2 requests for repeitition over 8 minutes.    Time 4   Period Weeks   Status New          SLP Long Term Goals - 07/17/16 1256    SLP LONG TERM GOAL #1   Title Pt will utilize compensations for aphasia over 15 minute mildly complex conversation with rare min A   Time 8   Period Weeks   Status New   SLP LONG TERM GOAL #2   Title Pt and spouse will report carryover of strategies to maximize auditory comprehension over 2 weeks with rare min A   Time 8   Period Weeks   Status New          Plan - 07/17/16 1241    Clinical Impression Statement Mr. Crompton, a 75 y.o. male with recent dx of MCI/aphasia is referred by neurologist. He is accompanied by his spouse and deferred to her for PMH information. Their primary complaint is word finding difficulties resulting in daily frustration with communication attempts. Mrs. Metzker also reports that her husband frequently asks her to repeat herself.  He does have bilateral ITE hearing aides which he does not wear at home. Pt  was not oriented to date and required verbal and calendar cues for month and year. He did not know the date. Mr.  Ramsdell recalled 50% of his medications. According to his spouse, she has always managed his schedule, appointments  and he is managing his meds with relative independence. They both agree that Mr. Stewart looses his keys, phone, etc but that "he has always done that since we were young"  As their primary concern is verbal expression, this was the focus of my evaluation. Mr. Dispenza demonstrated frequent aphasic errors in conversation with pauses and paraphasias. He is aware of these and attentmps to correct them. 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 The [place] relevealed a score  of 42/60, which is very low borderline, with  48.9 +/- 6.3 SD being [facility] .  Of the 42 he got correct, he  demonstrated semantic parahasias which he immediately corrected on 8/42. Mr. and Mrs. Modeste report daily frustration with communication. Mrs. Kolber states that her husband frequently says "What" immediately after she speaks. I suspect this is combination of hearing loss and reduced auditory processing/comprehension. In this quiet environment, Mr. Yousef did not need me to repeat myself. I recommend skilled ST to maximize verbal expression and compensations for aphasia for improved independence and to reduce his wife's burden.    Speech Therapy Frequency 2x / week   Duration --  8 weeks   Treatment/Interventions SLP instruction and feedback;Compensatory strategies;Internal/external aids;Environmental controls;Patient/family education;Functional tasks;Multimodal communcation approach   Potential to Achieve Goals Good   Potential Considerations Ability to learn/carryover information   Consulted and Agree with Plan of Care Patient;Family member/caregiver   Family Member Consulted spouse Jolayne Haines      Patient will benefit from skilled therapeutic intervention in order to improve the following deficits and impairments:   Aphasia - Plan: SLP plan of care cert/re-cert      G-Codes - [id number]    Functional  Assessment Tool Used NOMS   Functional Limitations Spoken language expressive   Spoken Language Expression Current Status [phone]) At least 20 percent but less than 40 percent impaired, limited or restricted   Spoken Language Expression Goal Status [id number]) At least 1 percent but less than 20 percent impaired, limited or restricted      Problem List Patient Active Problem List   Diagnosis Date Noted  . MCI (mild cognitive impairment) 06/19/2016  . Heart palpitations 10/28/2015  . Abnormal EKG 10/28/2015  . BPH (benign prostatic hypertrophy) 06/08/2015  . Rotator cuff syndrome of right shoulder 01/24/2015  . Dementia 01/24/2015  . Tenosynovitis of foot and ankle 04/30/2014  . Essential hypertension, benign 04/20/2014  . Heel spur 04/20/2014  . Erectile dysfunction 04/20/2014  . 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 SOB (shortness of breath) 04/20/2014  . Tinea corporis 04/20/2014  . History of skin cancer 04/20/2014  . Numerous moles 04/20/2014    Lovvorn, Annye Rusk MS, CCC-SLP 07/17/2016, 1:00 PM  [facility] [address] [place], [place], [address] Phone: [phone]   Fax:  [phone]  Name: Dametri Drane" [address] Anothony Wroblewski MRN: [id number] Date of Birth: Jun 09, 1941

## 2016-07-23 ENCOUNTER — Ambulatory Visit: Payer: Medicare Other

## 2016-07-23 DIAGNOSIS — R4701 Aphasia: Secondary | ICD-10-CM | POA: Diagnosis not present

## 2016-07-23 NOTE — Therapy (Signed)
[facility] [address] [place], [place], [address] Phone: [phone]   Fax:  [phone]  Speech Language Pathology Treatment  Patient Details  Name: Logan Cruz" Logan Cruz MRN: [id number] Date of Birth: 1941-05-27 Referring Provider: Dr. Naomie Dean  Encounter Date: 07/23/2016      End of Session - 07/23/16 0847    Visit Number 2   Number of Visits 17   Date for SLP Re-Evaluation 09/11/16   SLP Start Time 0808   SLP Stop Time  0847   SLP Time Calculation (min) 39 min   Activity Tolerance Patient tolerated treatment well      Past Medical History:  Diagnosis Date  . Arthritis   . GERD (gastroesophageal reflux disease)   . Hypertension     Past Surgical History:  Procedure Laterality Date  . CARPAL TUNNEL RELEASE Bilateral 1994  . ROTATOR CUFF REPAIR Right 1990  . TONSILLECTOMY AND ADENOIDECTOMY  1980's  . VASECTOMY  1972   rh Negative incompatibility    There were no vitals filed for this visit.      Subjective Assessment - 07/23/16 0824    Subjective "I forgot my hearing aids." "My reading and writing has never been good."   Currently in Pain? No/denies               ADULT SLP TREATMENT - 07/23/16 0824      General Information   Behavior/Cognition Alert;Cooperative;Pleasant mood     Treatment Provided   Treatment provided Cognitive-Linquistic     Cognitive-Linquistic Treatment   Treatment focused on Aphasia   Skilled Treatment SLP reviewed/educated pt on compensations for aphasia. Pt explaining away deficits at times. In mod complex naming tasks pt used compensations of description and rephrasing.  Success=88%, and incr'd to 100% with mod cues. In simple conversation pt using vague language but pt successful with using compensations in order to get point across. When specific speech was required (i.e., what pt had for homework) more difficulty was noted.      Assessment /  Recommendations / Plan   Plan Continue with current plan of care     Progression Toward Goals   Progression toward goals Progressing toward goals          SLP Education - 07/23/16 0847    Education provided Yes   Education Details compensations for anomia   Person(s) Educated Patient   Methods Explanation   Comprehension Verbalized understanding          SLP Short Term Goals - 07/23/16 0854      SLP SHORT TERM GOAL #1   Title Pt will perform moderately complex naming tasks with 85% accuracy and rare min A   Time 4   Period Weeks   Status On-going     SLP SHORT TERM GOAL #2   Title Pt will utlize compensations for aphasia during structured tasks with occasiona min A   Time 4   Period Weeks   Status On-going     SLP SHORT TERM GOAL #3   Title Pt will comprehend mildly complex conversation in quiet environment with 95% accuracy and 1-2 requests for repeitition over 8 minutes.    Time 4   Period Weeks   Status On-going          SLP Long Term Goals - 07/23/16 0855      SLP LONG TERM GOAL #1   Title Pt will utilize compensations for aphasia over 15 minute mildly  complex conversation with rare min A   Time 8   Period Weeks   Status On-going     SLP LONG TERM GOAL #2   Title Pt and spouse will report carryover of strategies to maximize auditory comprehension over 2 weeks with rare min A   Time 8   Period Weeks   Status On-going          Plan - 07/23/16 0848    Clinical Impression Statement Pt exhibits reduced verbal expression, explaining away deficits at times. Pt has difficulty in simple conversation and moreso with incr'd linguistic demands. Cont'd skilled St needed for improvement of verbal expression and for compensation use.   Speech Therapy Frequency 2x / week   Duration --  8 weeks   Treatment/Interventions SLP instruction and feedback;Compensatory strategies;Internal/external aids;Environmental controls;Patient/family education;Functional  tasks;Multimodal communcation approach   Potential to Achieve Goals Good   Potential Considerations Ability to learn/carryover information      Patient will benefit from skilled therapeutic intervention in order to improve the following deficits and impairments:   Aphasia    Problem List Patient Active Problem List   Diagnosis Date Noted  . MCI (mild cognitive impairment) 06/19/2016  . Heart palpitations 10/28/2015  . Abnormal EKG 10/28/2015  . BPH (benign prostatic hypertrophy) 06/08/2015  . Rotator cuff syndrome of right shoulder 01/24/2015  . Dementia 01/24/2015  . Tenosynovitis of foot and ankle 04/30/2014  . Essential hypertension, benign 04/20/2014  . Heel spur 04/20/2014  . Erectile dysfunction 04/20/2014  . 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 SOB (shortness of breath) 04/20/2014  . Tinea corporis 04/20/2014  . History of skin cancer 04/20/2014  . Numerous moles 04/20/2014    [facility] ,[place], [name]  07/23/2016, 8:56 AM  [facility] [address] [place], [place], [address] Phone: [phone]   Fax:  [phone]   Name: Logan Cruz" [address] Logan Cruz MRN: [id number] Date of Birth: 1941-08-23

## 2016-07-23 NOTE — Patient Instructions (Signed)
  Please complete the assigned speech therapy homework and return it to your next session.  

## 2016-07-25 ENCOUNTER — Ambulatory Visit: Payer: Medicare Other

## 2016-07-25 DIAGNOSIS — R4701 Aphasia: Secondary | ICD-10-CM

## 2016-07-25 NOTE — Therapy (Signed)
[facility] [address] [place], [place], [address] Phone: [phone]   Fax:  [phone]  Speech Language Pathology Treatment  Patient Details  Name: Logan Cruz" P Logan Cruz MRN: [id number] Date of Birth: [birth year] Referring Provider: Dr. Sarina Ill  Encounter Date: 07/25/2016      End of Session - 07/25/16 1231    Visit Number 3   Number of Visits 17   Date for SLP Re-Evaluation 09/11/16   SLP Start Time 0932   SLP Stop Time  [id number]   SLP Time Calculation (min) 43 min   Activity Tolerance Patient tolerated treatment well      Past Medical History:  Diagnosis Date  . Arthritis   . GERD (gastroesophageal reflux disease)   . Hypertension     Past Surgical History:  Procedure Laterality Date  . CARPAL TUNNEL RELEASE Bilateral 1994  . ROTATOR CUFF REPAIR Right 1990  . TONSILLECTOMY AND ADENOIDECTOMY  1980's  . VASECTOMY  1972   rh Negative incompatibility    There were no vitals filed for this visit.      Subjective Assessment - 07/25/16 0936    Subjective "My mind doesn't think about that stuff." (re: homework for similarity/differences being difficult)               ADULT SLP TREATMENT - 07/25/16 0937      General Information   Behavior/Cognition Alert;Cooperative;Pleasant mood     Pain Assessment   Pain Assessment 0-10   Pain Score 5    Pain Location knees   Pain Descriptors / Indicators Sore   Pain Intervention(s) Monitored during session     Cognitive-Linquistic Treatment   Treatment focused on Aphasia   Skilled Treatment SLP facilitated pt's verbal expression/expressive language by engaging him in tasks targeting naming and linguistic/semantic connection. In "3 clues" task, pt with 60% success with extra time (<5seconds), and 85% success with min-mod SLP cues.  Word association tasks: pt with uncommon linguistic connections - for example with "beach", pt generated "kayak",  and other more uncommon but appropriate words. In simple conversation, SLP needed careful listening occasionally to comprehend pt's message. Pt used compensations discussed last session but not always successfully.     Assessment / Recommendations / Plan   Plan Continue with current plan of care     Progression Toward Goals   Progression toward goals Progressing toward goals            SLP Short Term Goals - 07/25/16 1232      SLP SHORT TERM GOAL #1   Title Pt will perform moderately complex naming tasks with 85% accuracy and rare min A   Time 4   Period Weeks   Status On-going     SLP SHORT TERM GOAL #2   Title Pt will utlize compensations for aphasia during structured tasks with occasiona min A   Time 4   Period Weeks   Status On-going     SLP SHORT TERM GOAL #3   Title Pt will comprehend mildly complex conversation in quiet environment with 95% accuracy and 1-2 requests for repeitition over 8 minutes.    Time 4   Period Weeks   Status On-going          SLP Long Term Goals - 07/25/16 1233      SLP LONG TERM GOAL #1   Title Pt will utilize compensations for aphasia over 15 minute mildly complex conversation with rare min A  Time 8   Period Weeks   Status On-going     SLP LONG TERM GOAL #2   Title Pt and spouse will report carryover of strategies to maximize auditory comprehension over 2 weeks with rare min A   Time 8   Period Weeks   Status On-going          Plan - 07/25/16 1232    Clinical Impression Statement Pt exhibits reduced verbal expression, explaining away deficits at times. Pt has difficulty in simple conversation and moreso with incr'd linguistic demands. Pt's rate of speech continues faster than pt is able to communicate successfull, and will likely require focused work with reducing his rate. Cont'd skilled St needed for improvement of verbal expression and for compensation use.   Speech Therapy Frequency 2x / week   Duration --  8 weeks    Treatment/Interventions SLP instruction and feedback;Compensatory strategies;Internal/external aids;Environmental controls;Patient/family education;Functional tasks;Multimodal communcation approach   Potential to Achieve Goals Good   Potential Considerations Ability to learn/carryover information      Patient will benefit from skilled therapeutic intervention in order to improve the following deficits and impairments:   Aphasia    Problem List Patient Active Problem List   Diagnosis Date Noted  . MCI (mild cognitive impairment) 06/19/2016  . Heart palpitations 10/28/2015  . Abnormal EKG 10/28/2015  . BPH (benign prostatic hypertrophy) 06/08/2015  . Rotator cuff syndrome of right shoulder 01/24/2015  . Dementia 01/24/2015  . Tenosynovitis of foot and ankle 04/30/2014  . Essential hypertension, benign 04/20/2014  . Heel spur 04/20/2014  . Erectile dysfunction 04/20/2014  . 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 SOB (shortness of breath) 04/20/2014  . Tinea corporis 04/20/2014  . History of skin cancer 04/20/2014  . Numerous moles 04/20/2014    [facility] ,[place], [place]  07/25/2016, 12:33 PM  [facility] [address] [place], [place], [address] Phone: [phone]   Fax:  [phone]   Name: Logan Cruz" [address] Logan Cruz MRN: [id number] Date of Birth: 14-Sep-1941

## 2016-07-25 NOTE — Patient Instructions (Signed)
  Please complete the assigned speech therapy homework and return it to your next session.  

## 2016-07-30 ENCOUNTER — Ambulatory Visit: Payer: Medicare Other

## 2016-07-30 DIAGNOSIS — R4701 Aphasia: Secondary | ICD-10-CM | POA: Diagnosis not present

## 2016-07-30 NOTE — Therapy (Signed)
[facility] [address] [place], [place], [address] Phone: [phone]   Fax:  [phone]  Speech Language Pathology Treatment  Patient Details  Name: Rashaud Frederique" P Kem Alessi MRN: [id number] Date of Birth: 06-Oct-1941 Referring Provider: Dr. Sarina Ill  Encounter Date: 07/30/2016      End of Session - 07/30/16 0932    Visit Number 4   Number of Visits 17   Date for SLP Re-Evaluation 09/11/16   SLP Start Time 0858   SLP Stop Time  0933   SLP Time Calculation (min) 35 min      Past Medical History:  Diagnosis Date  . Arthritis   . GERD (gastroesophageal reflux disease)   . Hypertension     Past Surgical History:  Procedure Laterality Date  . CARPAL TUNNEL RELEASE Bilateral 1994  . ROTATOR CUFF REPAIR Right 1990  . TONSILLECTOMY AND ADENOIDECTOMY  1980's  . VASECTOMY  1972   rh Negative incompatibility    There were no vitals filed for this visit.      Subjective Assessment - 07/30/16 0902    Subjective "I had a busy weekend."   Currently in Pain? No/denies               ADULT SLP TREATMENT - 07/30/16 0904      General Information   Behavior/Cognition Alert;Cooperative;Pleasant mood     Treatment Provided   Treatment provided Cognitive-Linquistic     Cognitive-Linquistic Treatment   Treatment focused on Aphasia   Skilled Treatment SLP engaged pt in naming tasks to work on aphsaia/expressive language deficits. In simple to mod complex naming task (confrontation) pt with 90% success. SLP educated pt on reduced rate as strategy.  Conversation after this was more sucessful/fluid than prior. Pt with approx 60% success.     Assessment / Recommendations / Plan   Plan Continue with current plan of care     Progression Toward Goals   Progression toward goals Progressing toward goals            SLP Short Term Goals - 07/30/16 0934      SLP SHORT TERM GOAL #1   Title Pt will  perform moderately complex naming tasks with 85% accuracy and rare min A   Time 3   Period Weeks   Status On-going     SLP SHORT TERM GOAL #2   Title Pt will utlize compensations for aphasia during structured tasks with occasiona min A   Time 3   Period Weeks   Status On-going     SLP SHORT TERM GOAL #3   Title Pt will comprehend mildly complex conversation in quiet environment with 95% accuracy and 1-2 requests for repeitition over 8 minutes.    Time 3   Period Weeks   Status On-going          SLP Long Term Goals - 07/30/16 0934      SLP LONG TERM GOAL #1   Title Pt will utilize compensations for aphasia over 15 minute mildly complex conversation with rare min A   Time 7   Period Weeks   Status On-going     SLP LONG TERM GOAL #2   Title Pt and spouse will report carryover of strategies to maximize auditory comprehension over 2 weeks with rare min A   Time 7   Period Weeks   Status On-going          Plan - 07/30/16 0932    Clinical  Impression Statement Pt exhibits reduced verbal expression, explaining away deficits at times. Pt has difficulty in simple conversation and moreso with incr'd linguistic demands, however today slowed his rate and had incr'd fluidity. Cont'd skilled St needed for improvement of verbal expression and for compensation use.      Patient will benefit from skilled therapeutic intervention in order to improve the following deficits and impairments:   Aphasia    Problem List Patient Active Problem List   Diagnosis Date Noted  . MCI (mild cognitive impairment) 06/19/2016  . Heart palpitations 10/28/2015  . Abnormal EKG 10/28/2015  . BPH (benign prostatic hypertrophy) 06/08/2015  . Rotator cuff syndrome of right shoulder 01/24/2015  . Dementia 01/24/2015  . Tenosynovitis of foot and ankle 04/30/2014  . Essential hypertension, benign 04/20/2014  . Heel spur 04/20/2014  . Erectile dysfunction 04/20/2014  . 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 SOB  (shortness of breath) 04/20/2014  . Tinea corporis 04/20/2014  . History of skin cancer 04/20/2014  . Numerous moles 04/20/2014    [facility] ,[place], [place]  07/30/2016, 9:34 AM  [facility] [address] [place], [place], [address] Phone: [phone]   Fax:  [phone]   Name: Shneur Ulrich" [address] Omri Caponi MRN: [id number] Date of Birth: [birth year]

## 2016-08-01 ENCOUNTER — Ambulatory Visit: Payer: Medicare Other | Attending: Neurology

## 2016-08-01 DIAGNOSIS — R4701 Aphasia: Secondary | ICD-10-CM | POA: Diagnosis present

## 2016-08-01 NOTE — Therapy (Signed)
[facility] [address] [place], [place], [address] Phone: [phone]   Fax:  [phone]  Speech Language Pathology Treatment  Patient Details  Name: Logan Cruz" P Illias Argetsinger MRN: [id number] Date of Birth: 1941/08/10 Referring Provider: Dr. Sarina Ill  Encounter Date: 08/01/2016      End of Session - 08/01/16 0928    Visit Number 5   Number of Visits 17   Date for SLP Re-Evaluation 09/11/16   SLP Start Time 0850   SLP Stop Time  0930   SLP Time Calculation (min) 40 min   Activity Tolerance Patient tolerated treatment well      Past Medical History:  Diagnosis Date  . Arthritis   . GERD (gastroesophageal reflux disease)   . Hypertension     Past Surgical History:  Procedure Laterality Date  . CARPAL TUNNEL RELEASE Bilateral 1994  . ROTATOR CUFF REPAIR Right 1990  . TONSILLECTOMY AND ADENOIDECTOMY  1980's  . VASECTOMY  1972   rh Negative incompatibility    There were no vitals filed for this visit.      Subjective Assessment - 08/01/16 0857    Subjective "Just talking to her I think I (slowed my rate)."               ADULT SLP TREATMENT - 08/01/16 0855      General Information   Behavior/Cognition Alert;Cooperative;Pleasant mood     Treatment Provided   Treatment provided Cognitive-Linquistic     Pain Assessment   Pain Assessment 0-10   Pain Score 5    Pain Location knees   Pain Descriptors / Indicators Sore   Pain Intervention(s) Monitored during session     Cognitive-Linquistic Treatment   Treatment focused on Aphasia   Skilled Treatment SLP facilitated pt's speech fluidity/efficiency with 1-2 sentence tasks working on compensations (slower rate, anomia compensations). Pt with 80% success with reduced rate, and req'd min SLP A x3 to use compensations. Pt told SLP compensations for anomia (describe, rephrase, synonym) with min-mod cues. In conversation between tasks, pt did  not use slower rate until prompted/reminded by SLP to do so. After this pt demo'd slowed reate approx 70% of the time.     Assessment / Recommendations / Plan   Plan Continue with current plan of care     Progression Toward Goals   Progression toward goals Progressing toward goals            SLP Short Term Goals - 08/01/16 0929      SLP SHORT TERM GOAL #1   Title Pt will perform moderately complex naming tasks with 85% accuracy and rare min A   Time 3   Period Weeks   Status On-going     SLP SHORT TERM GOAL #2   Title Pt will utlize compensations for aphasia during structured tasks with occasiona min A   Time 3   Period Weeks   Status On-going     SLP SHORT TERM GOAL #3   Title Pt will comprehend mildly complex conversation in quiet environment with 95% accuracy and 1-2 requests for repeitition over 8 minutes.    Time 3   Period Weeks   Status On-going          SLP Long Term Goals - 08/01/16 0929      SLP LONG TERM GOAL #1   Title Pt will utilize compensations for aphasia over 15 minute mildly complex conversation with rare min A   Time  7   Period Weeks   Status On-going     SLP LONG TERM GOAL #2   Title Pt and spouse will report carryover of strategies to maximize auditory comprehension over 2 weeks with rare min A   Time 7   Period Weeks   Status On-going          Plan - 08/01/16 [id number]    Clinical Impression Statement Pt exhibits reduced verbal expression, explaining away deficits at times. Pt has difficulty in simple conversation and moreso with incr'd linguistic demands, however today slowed his rate and had incr'd fluidity. Cont'd skilled St needed for improvement of verbal expression and for compensation use.   Duration --  7 weeks      Patient will benefit from skilled therapeutic intervention in order to improve the following deficits and impairments:   Aphasia    Problem List Patient Active Problem List   Diagnosis Date Noted  . MCI (mild  cognitive impairment) 06/19/2016  . Heart palpitations 10/28/2015  . Abnormal EKG 10/28/2015  . BPH (benign prostatic hypertrophy) 06/08/2015  . Rotator cuff syndrome of right shoulder 01/24/2015  . Dementia 01/24/2015  . Tenosynovitis of foot and ankle 04/30/2014  . Essential hypertension, benign 04/20/2014  . Heel spur 04/20/2014  . Erectile dysfunction 04/20/2014  . 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 SOB (shortness of breath) 04/20/2014  . Tinea corporis 04/20/2014  . History of skin cancer 04/20/2014  . Numerous moles 04/20/2014    [facility] ,MS, CCC-SLP  08/01/2016, 9:31 AM  [facility] [address] [place], [place], [address] Phone: [phone]   Fax:  [phone]   Name: Logan Cruz" [address] Wadie Rodkey MRN: [id number] Date of Birth: Sep 20, 1941

## 2016-08-01 NOTE — Patient Instructions (Signed)
  Please complete the assigned speech therapy homework prior to your next session.  

## 2016-08-06 ENCOUNTER — Ambulatory Visit: Payer: Medicare Other | Admitting: *Deleted

## 2016-08-06 DIAGNOSIS — R4701 Aphasia: Secondary | ICD-10-CM

## 2016-08-06 NOTE — Therapy (Signed)
[facility] [address] [place], [place], [address] Phone: [phone]   Fax:  [phone]  Speech Language Pathology Treatment  Patient Details  Name: Logan Cruz" P Jayk Mccommon MRN: [id number] Date of Birth: 03/09/1941 Referring Provider: Dr. Sarina Ill  Encounter Date: 08/06/2016      End of Session - 08/06/16 0858    Visit Number 6   Number of Visits 17   Date for SLP Re-Evaluation 09/11/16   SLP Start Time 0800   SLP Stop Time  [id number]   SLP Time Calculation (min) 47 min   Activity Tolerance Patient tolerated treatment well      Past Medical History:  Diagnosis Date  . Arthritis   . GERD (gastroesophageal reflux disease)   . Hypertension     Past Surgical History:  Procedure Laterality Date  . CARPAL TUNNEL RELEASE Bilateral 1994  . ROTATOR CUFF REPAIR Right 1990  . TONSILLECTOMY AND ADENOIDECTOMY  1980's  . VASECTOMY  1972   rh Negative incompatibility    There were no vitals filed for this visit.      Subjective Assessment - 08/06/16 0816    Subjective "I'm doing pretty good."   Currently in Pain? No/denies               ADULT SLP TREATMENT - 08/06/16 0817      General Information   Behavior/Cognition Alert;Cooperative;Pleasant mood     Treatment Provided   Treatment provided Cognitive-Linquistic     Pain Assessment   Pain Assessment No/denies pain     Cognitive-Linquistic Treatment   Treatment focused on Aphasia   Skilled Treatment Pt described circumlocution as a strategy for word recall at mod I and required min- mod A from therapist to successfully recall other strategies of description, rephrasing and synonyms. Pt completed divergent naming tasks [facility] with modification of increased time. Pt with excellent demonstration of fluency of conversation despite word-finding difficulty. Discussed current memory limitations and need for compensation to increase independent  functionality. Pt to acquire a day planner or memo book/calendar for note taking to organize his schedule and recall important details.       Assessment / Recommendations / Plan   Plan Continue with current plan of care     Progression Toward Goals   Progression toward goals Progressing toward goals          SLP Education - 08/06/16 0858    Education provided Yes   Education Details compensations for anomia and recall of daily information   Person(s) Educated Patient   Methods Explanation   Comprehension Verbalized understanding;Need further instruction          SLP Short Term Goals - 08/06/16 0902      SLP SHORT TERM GOAL #1   Title Pt will perform moderately complex naming tasks with 85% accuracy and rare min A   Time 3   Period Weeks   Status On-going     SLP SHORT TERM GOAL #2   Title Pt will utlize compensations for aphasia during structured tasks with occasiona min A   Time 3   Period Weeks   Status On-going     SLP SHORT TERM GOAL #3   Title Pt will comprehend mildly complex conversation in quiet environment with 95% accuracy and 1-2 requests for repeitition over 8 minutes.    Time 3   Period Weeks   Status On-going          SLP Long Term Goals -  08/06/16 0903      SLP LONG TERM GOAL #1   Title Pt will utilize compensations for aphasia over 15 minute mildly complex conversation with rare min A   Time 7   Period Weeks   Status On-going     SLP LONG TERM GOAL #2   Title Pt and spouse will report carryover of strategies to maximize auditory comprehension over 2 weeks with rare min A   Time 7   Period Weeks   Status On-going          Plan - 08/06/16 0859    Clinical Impression Statement Pt continues to demonstrate word-finding deficits and difficulty with recall which negatively impact his functional independence. Pt is demonstrating transition into anticipatory awareness by utilizing compensatory strategies and verbalizing awareness of need to  rely on written language for recall of important information.   Speech Therapy Frequency 2x / week   Duration --  7 weeks   Treatment/Interventions SLP instruction and feedback;Compensatory strategies;Internal/external aids;Environmental controls;Patient/family education;Functional tasks;Multimodal communcation approach   Potential to Achieve Goals Good   Potential Considerations Ability to learn/carryover information      Patient will benefit from skilled therapeutic intervention in order to improve the following deficits and impairments:   Aphasia    Problem List Patient Active Problem List   Diagnosis Date Noted  . MCI (mild cognitive impairment) 06/19/2016  . Heart palpitations 10/28/2015  . Abnormal EKG 10/28/2015  . BPH (benign prostatic hypertrophy) 06/08/2015  . Rotator cuff syndrome of right shoulder 01/24/2015  . Dementia 01/24/2015  . Tenosynovitis of foot and ankle 04/30/2014  . Essential hypertension, benign 04/20/2014  . Heel spur 04/20/2014  . Erectile dysfunction 04/20/2014  . 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 SOB (shortness of breath) 04/20/2014  . Tinea corporis 04/20/2014  . History of skin cancer 04/20/2014  . Numerous moles 04/20/2014    Vinetta Bergamo MA, CCC-SLP 08/06/2016, 9:04 AM  [facility] [address] [place], [place], [address] Phone: [phone]   Fax:  [phone]   Name: Logan Cruz" [address] Glade San MRN: [id number] Date of Birth: Feb 06, 1941

## 2016-08-09 ENCOUNTER — Ambulatory Visit: Payer: Medicare Other | Admitting: Speech Pathology

## 2016-08-09 DIAGNOSIS — R4701 Aphasia: Secondary | ICD-10-CM

## 2016-08-09 NOTE — Therapy (Signed)
[facility] [address] [place], [place], [address] Phone: [phone]   Fax:  [phone]  Speech Language Pathology Treatment  Patient Details  Name: Logan Cruz" P Nemiah Scallion MRN: [id number] Date of Birth: 12-Aug-1941 Referring Provider: Dr. Sarina Ill  Encounter Date: 08/09/2016      End of Session - 08/09/16 1221    Visit Number 7   Number of Visits 17   Date for SLP Re-Evaluation 09/11/16   SLP Start Time 0850   SLP Stop Time  0932   SLP Time Calculation (min) 42 min      Past Medical History:  Diagnosis Date  . Arthritis   . GERD (gastroesophageal reflux disease)   . Hypertension     Past Surgical History:  Procedure Laterality Date  . CARPAL TUNNEL RELEASE Bilateral 1994  . ROTATOR CUFF REPAIR Right 1990  . TONSILLECTOMY AND ADENOIDECTOMY  1980's  . VASECTOMY  1972   rh Negative incompatibility    There were no vitals filed for this visit.      Subjective Assessment - 08/09/16 0858    Subjective "My wife is out of town so I am alone"               ADULT SLP TREATMENT - 08/09/16 0859      General Information   Behavior/Cognition Alert;Cooperative;Pleasant mood     Treatment Provided   Treatment provided Cognitive-Linquistic     Pain Assessment   Pain Assessment 0-10   Pain Score 5    Pain Location bilateral knees   Pain Descriptors / Indicators Aching   Pain Intervention(s) Monitored during session     Cognitive-Linquistic Treatment   Treatment focused on Aphasia   Skilled Treatment Divergent naming facilitated with mildly complex categories with usual min to mod semantic cues pt named 5-7 items. Facilitated compensations for aphasia having pt describe words on a list for ST to identify. Pt participated in simple conversation with 80% accuracy and occasional min request to use compensationsn to improve acccuracy of message.      Assessment / Recommendations / Plan    Plan Continue with current plan of care     Progression Toward Goals   Progression toward goals Progressing toward goals            SLP Short Term Goals - 08/09/16 1221      SLP SHORT TERM GOAL #1   Title Pt will perform moderately complex naming tasks with 85% accuracy and rare min A   Time 3   Period Weeks   Status On-going     SLP SHORT TERM GOAL #2   Title Pt will utlize compensations for aphasia during structured tasks with occasiona min A   Time 3   Period Weeks   Status On-going     SLP SHORT TERM GOAL #3   Title Pt will comprehend mildly complex conversation in quiet environment with 95% accuracy and 1-2 requests for repeitition over 8 minutes.    Time 3   Period Weeks   Status On-going          SLP Long Term Goals - 08/09/16 1221      SLP LONG TERM GOAL #1   Title Pt will utilize compensations for aphasia over 15 minute mildly complex conversation with rare min A   Time 6   Period Weeks   Status On-going     SLP LONG TERM GOAL #2   Title Pt and spouse will  report carryover of strategies to maximize auditory comprehension over 2 weeks with rare min A   Time 6   Period Weeks   Status On-going          Plan - 08/09/16 1221    Clinical Impression Statement Pt continues to demonstrate word-finding deficits and difficulty with recall which negatively impact his functional independence. Pt is demonstrating transition into anticipatory awareness by utilizing compensatory strategies and verbalizing awareness of need to rely on written language for recall of important information.   Speech Therapy Frequency 2x / week   Treatment/Interventions SLP instruction and feedback;Compensatory strategies;Internal/external aids;Environmental controls;Patient/family education;Functional tasks;Multimodal communcation approach   Potential to Achieve Goals Good   Potential Considerations Ability to learn/carryover information   Consulted and Agree with Plan of Care Patient       Patient will benefit from skilled therapeutic intervention in order to improve the following deficits and impairments:   Aphasia    Problem List Patient Active Problem List   Diagnosis Date Noted  . MCI (mild cognitive impairment) 06/19/2016  . Heart palpitations 10/28/2015  . Abnormal EKG 10/28/2015  . BPH (benign prostatic hypertrophy) 06/08/2015  . Rotator cuff syndrome of right shoulder 01/24/2015  . Dementia 01/24/2015  . Tenosynovitis of foot and ankle 04/30/2014  . Essential hypertension, benign 04/20/2014  . Heel spur 04/20/2014  . Erectile dysfunction 04/20/2014  . 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 SOB (shortness of breath) 04/20/2014  . Tinea corporis 04/20/2014  . History of skin cancer 04/20/2014  . Numerous moles 04/20/2014    Zoelle Markus, Annye Rusk MS, CCC-SLP 08/09/2016, 12:22 PM  [facility] [address] [place], [place], [address] Phone: [phone]   Fax:  [phone]   Name: Logan Cruz" [address] Tug Reum MRN: [id number] Date of Birth: 03-25-1941

## 2016-08-10 ENCOUNTER — Ambulatory Visit (INDEPENDENT_AMBULATORY_CARE_PROVIDER_SITE_OTHER): Payer: Medicare Other | Admitting: Family Medicine

## 2016-08-10 ENCOUNTER — Encounter: Payer: Self-pay | Admitting: Family Medicine

## 2016-08-10 VITALS — BP 132/88 | HR 84 | Temp 98.6°F | Resp 16 | Ht 73.0 in | Wt 239.0 lb

## 2016-08-10 DIAGNOSIS — M79643 Pain in unspecified hand: Secondary | ICD-10-CM

## 2016-08-10 DIAGNOSIS — M159 Polyosteoarthritis, unspecified: Secondary | ICD-10-CM | POA: Diagnosis not present

## 2016-08-10 DIAGNOSIS — H6123 Impacted cerumen, bilateral: Secondary | ICD-10-CM

## 2016-08-10 DIAGNOSIS — M1990 Unspecified osteoarthritis, unspecified site: Secondary | ICD-10-CM | POA: Diagnosis not present

## 2016-08-10 DIAGNOSIS — M19049 Primary osteoarthritis, unspecified hand: Secondary | ICD-10-CM

## 2016-08-10 MED ORDER — TRAMADOL-ACETAMINOPHEN 37.5-325 MG PO TABS
1.0000 | ORAL_TABLET | Freq: Two times a day (BID) | ORAL | 1 refills | Status: AC | PRN
Start: 2016-08-10 — End: ?

## 2016-08-10 NOTE — Patient Instructions (Signed)
Take Ultracet for pain twice a day as needed Decrease celebrex to once a day  F/U as previous

## 2016-08-10 NOTE — Progress Notes (Signed)
   Subjective:    Patient ID: Logan Cruz [address] Logan Cruz, male    DOB: [birth year], 75 y.o.   MRN: [id number]  Patient presents for Hand Injury (hurts to grip left hand x 2days ) and Ear Fullness  Bilat ear fullness, feels like wax, has hearing aids   Was using riding lawnmower afterwards had pain in left hand at knuckles and wrist , no swelling, unable to make a complete fist, has known arthritis, is using celebrex BID but not helping , no fall or specific injury to hand     Review Of Systems:  GEN- denies fatigue, fever, weight loss,weakness, recent illness HEENT- denies eye drainage, change in vision, nasal discharge, CVS- denies chest pain, palpitations RESP- denies SOB, cough, wheeze ABD- denies N/V, change in stools, abd pain GU- denies dysuria, hematuria, dribbling, incontinence MSK-+ joint pain, muscle aches, injury Neuro- denies headache, dizziness, syncope, seizure activity       Objective:    BP 132/88   Pulse 84   Temp 98.6 F (37 C) (Oral)   Resp 16   Ht 6\' 1"  (1.854 m)   Wt 239 lb (108.4 kg)   SpO2 98%   BMI 31.53 kg/m  GEN- NAD, alert and oriented x3 HEENT- PERRL, EOMI, non injected sclera, pink conjunctiva, MMM, oropharynx clear, bilat wax impaction  MSK- bilat hand, normal inspection, no swelling, neg finkelstiens, ,fair grasp left hand, able to make loose fist, mild TTP, NT over joints, able to move digits independtly  Good ROM Wrist         Assessment & Plan:      Problem List Items Addressed This Visit    Generalized OA - Primary   Relevant Medications   traMADol-acetaminophen (ULTRACET) 37.5-325 MG tablet    Other Visit Diagnoses    Cerumen impaction, bilateral       s/p irrigation, TM intact, mild bleeding in left canal, no overt infection    Arthritis pain, hand       with history OA in hand, overuse, given Ultracet , decrease celebrex to once a day for now      Note: This dictation was prepared with Dragon dictation along with smaller  phrase technology. Any transcriptional errors that result from this process are unintentional.

## 2016-08-12 ENCOUNTER — Encounter: Payer: Self-pay | Admitting: Family Medicine

## 2016-08-13 ENCOUNTER — Ambulatory Visit: Payer: Medicare Other | Admitting: Speech Pathology

## 2016-08-13 ENCOUNTER — Telehealth: Payer: Self-pay | Admitting: Family Medicine

## 2016-08-13 DIAGNOSIS — R4701 Aphasia: Secondary | ICD-10-CM | POA: Diagnosis not present

## 2016-08-13 MED ORDER — METHYLPREDNISOLONE 4 MG PO TBPK: ORAL_TABLET | ORAL | 0 refills | Status: DC

## 2016-08-13 NOTE — Telephone Encounter (Signed)
Call placed to patient. No answer. No VM.  

## 2016-08-13 NOTE — Telephone Encounter (Signed)
I saw patient today in a parking lot on the way in. He states that his left hand swelled up over the weekend he has some pain trying to make a fist. He been using ice he took the Ultracet as well. We will give him a Medrol dosepak and will get follow-up in 48 hours and see how he is doing. No sign of any infection  GET APPT FOR PT FOR WED FOR RECHECK STEROID SENT TO PHARMACY

## 2016-08-13 NOTE — Therapy (Signed)
[facility] [address] [place], [place], [address] Phone: [phone]   Fax:  [phone]  Speech Language Pathology Treatment  Patient Details  Name: Logan Cruz" P Amond Toutant MRN: [id number] Date of Birth: 1941-12-03 Referring Provider: Dr. Sarina Ill  Encounter Date: 08/13/2016      End of Session - 08/13/16 1508    Visit Number 8   Number of Visits 17   Date for SLP Re-Evaluation 09/11/16   SLP Start Time 0846   SLP Stop Time  0928   SLP Time Calculation (min) 42 min   Activity Tolerance Patient tolerated treatment well      Past Medical History:  Diagnosis Date  . Arthritis   . GERD (gastroesophageal reflux disease)   . Hypertension     Past Surgical History:  Procedure Laterality Date  . CARPAL TUNNEL RELEASE Bilateral 1994  . ROTATOR CUFF REPAIR Right 1990  . TONSILLECTOMY AND ADENOIDECTOMY  1980's  . VASECTOMY  1972   rh Negative incompatibility    There were no vitals filed for this visit.      Subjective Assessment - 08/13/16 0854    Subjective "I didn't do very good because I couldn't write" - pt with right hand swelling - he has seen MD x2 for this is will start steroids today               ADULT SLP TREATMENT - 08/13/16 0855      General Information   Behavior/Cognition Alert;Cooperative;Pleasant mood     Treatment Provided   Treatment provided Cognitive-Linquistic     Pain Assessment   Pain Assessment 0-10   Pain Score 7    Pain Location left hand   Pain Descriptors / Indicators Heaviness;Pressure   Pain Intervention(s) PCA encouraged;Monitored during session     Cognitive-Linquistic Treatment   Treatment focused on Aphasia   Skilled Treatment Facilitated verbal expression generating sentences with multiple meaning words with extended time and rare min cues for more than one meaning. Pt also required occasional min cues to read the word due to aphasia.  Facilitated compensations for aphasia describing objects for ST to guess     Assessment / Recommendations / [place] with current plan of care     Progression Toward Goals   Progression toward goals Progressing toward goals            SLP Short Term Goals - 08/13/16 1507      SLP SHORT TERM GOAL #1   Title Pt will perform moderately complex naming tasks with 85% accuracy and rare min A   Time 2   Period Weeks   Status On-going     SLP SHORT TERM GOAL #2   Title Pt will utlize compensations for aphasia during structured tasks with occasiona min A   Time 2   Period Weeks   Status On-going     SLP SHORT TERM GOAL #3   Title Pt will comprehend mildly complex conversation in quiet environment with 95% accuracy and 1-2 requests for repeitition over 8 minutes.    Time 2   Period Weeks   Status On-going          SLP Long Term Goals - 08/13/16 1508      SLP LONG TERM GOAL #1   Title Pt will utilize compensations for aphasia over 15 minute mildly complex conversation with rare min A   Time 5   Period Weeks   Status  On-going     SLP LONG TERM GOAL #2   Title Pt and spouse will report carryover of strategies to maximize auditory comprehension over 2 weeks with rare min A   Time 5   Period Weeks   Status On-going          Plan - 08/13/16 1506    Clinical Impression Statement Significant aphasia persists - pt requiring cue to read at word level - continues to require cues for compensations and written cues. Continue skilled ST to maximize verbal expression and independence.    Speech Therapy Frequency 2x / week   Duration --  6 weeks   Treatment/Interventions SLP instruction and feedback;Compensatory strategies;Internal/external aids;Environmental controls;Patient/family education;Functional tasks;Multimodal communcation approach   Potential to Achieve Goals Good   Potential Considerations Ability to learn/carryover information   Consulted and Agree with  Plan of Care Patient      Patient will benefit from skilled therapeutic intervention in order to improve the following deficits and impairments:   Aphasia    Problem List Patient Active Problem List   Diagnosis Date Noted  . MCI (mild cognitive impairment) 06/19/2016  . Heart palpitations 10/28/2015  . Abnormal EKG 10/28/2015  . BPH (benign prostatic hypertrophy) 06/08/2015  . Rotator cuff syndrome of right shoulder 01/24/2015  . Dementia 01/24/2015  . Tenosynovitis of foot and ankle 04/30/2014  . Essential hypertension, benign 04/20/2014  . Heel spur 04/20/2014  . Erectile dysfunction 04/20/2014  . 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 SOB (shortness of breath) 04/20/2014  . Tinea corporis 04/20/2014  . History of skin cancer 04/20/2014  . Numerous moles 04/20/2014    Lovvorn, Annye Rusk MS, CCC-SLP 08/13/2016, 3:08 PM  [facility] [address] [place], [place], [address] Phone: [phone]   Fax:  [phone]   Name: Javario Foyt" [address] [place] MRN: [id number] Date of Birth: 1941/03/02

## 2016-08-14 ENCOUNTER — Ambulatory Visit (INDEPENDENT_AMBULATORY_CARE_PROVIDER_SITE_OTHER): Payer: Medicare Other | Admitting: Family Medicine

## 2016-08-14 ENCOUNTER — Encounter: Payer: Self-pay | Admitting: Family Medicine

## 2016-08-14 VITALS — BP 138/74 | HR 82 | Temp 98.6°F | Resp 14 | Ht 73.0 in | Wt 237.0 lb

## 2016-08-14 DIAGNOSIS — M7989 Other specified soft tissue disorders: Secondary | ICD-10-CM | POA: Diagnosis not present

## 2016-08-14 LAB — CBC WITH DIFFERENTIAL/PLATELET
BASOS ABS: 0 {cells}/uL (ref 0–200)
BASOS PCT: 0 %
EOS PCT: 0 %
Eosinophils Absolute: 0 cells/uL — ABNORMAL LOW (ref 15–500)
HCT: 45.3 % (ref 38.5–50.0)
HEMOGLOBIN: 15.1 g/dL (ref 13.0–17.0)
Lymphs Abs: 1122 cells/uL (ref 850–3900)
MCH: 28.9 pg (ref 27.0–33.0)
MCHC: 33.3 g/dL (ref 32.0–36.0)
MCV: 86.8 fL (ref 80.0–100.0)
MONOS PCT: 10 %
MPV: 10.1 fL (ref 7.5–12.5)
Monocytes Absolute: 660 cells/uL (ref 200–950)
NEUTROS ABS: 4818 {cells}/uL (ref 1500–7800)
Neutrophils Relative %: 73 %
PLATELETS: 181 10*3/uL (ref 140–400)
RBC: 5.22 MIL/uL (ref 4.20–5.80)
RDW: 14.3 % (ref 11.0–15.0)
WBC: 6.6 10*3/uL (ref 3.8–10.8)

## 2016-08-14 LAB — URIC ACID: URIC ACID, SERUM: 4.9 mg/dL (ref 4.0–8.0)

## 2016-08-14 NOTE — Progress Notes (Signed)
   Subjective:    Patient ID: Logan Cruz [id number] [name], male    DOB: Mar 20, 1941, 75 y.o.   MRN: [id number]  Patient presents for Edema (L hand- swelling and some pain to L hand- thinks it's gout in wrist- has picked up steroid, but did not start taking) Patient here to follow-up his left hand pain and swelling. He was seen on the 11th at that time he did not have any significant swelling just pain at the joints. I gave him Ultracet to use for pain and decrease his Celebrex to just once a day. He states that then a day or 2 later the entire hand swelled up he's concerned it feels like gout as he's had gout in his ankle before. I actually prescribed Medrol Dosepak yesterday after I saw him in the parking lot with swelling in his hand. Please see previous phone no. He has not started this yet. He has not had any fever no redness or drainage from his hand    Review Of Systems:  GEN- denies fatigue, fever, weight loss,weakness, recent illness HEENT- denies eye drainage, change in vision, nasal discharge, CVS- denies chest pain, palpitations RESP- denies SOB, cough, wheeze ABD- denies N/V, change in stools, abd pain GU- denies dysuria, hematuria, dribbling, incontinence MSK- + joint pain, muscle aches, injury Neuro- denies headache, dizziness, syncope, seizure activity       Objective:    BP 138/74 (BP Location: Right Arm, Patient Position: Sitting, Cuff Size: Large)   Pulse 82   Temp 98.6 F (37 C) (Oral)   Resp 14   Ht 6\' 1"  (1.854 m)   Wt 237 lb (107.5 kg)   BMI 31.27 kg/m  GEN- NAD, alert and oriented x3 MSK- left hand,+ swelling of entire hand, no erythema, decrease grasp, unable to make fist due to swelling, TTP at wrist, along joints,mild warmth  , able to move digits independtly          Assessment & Plan:      Problem List Items Addressed This Visit    None    Visit Diagnoses    Swelling of left hand    -  Primary   Concern intially just OA hand, but now with  swelling, possible gout, I still dont see any entrance of infection and would be very delayed reaction. Check uric acid level, start steroids Check CBC as well   Relevant Orders   Uric Acid   CBC with Differential/Platelet      Note: This dictation was prepared with Dragon dictation along with smaller phrase technology. Any transcriptional errors that result from this process are unintentional.

## 2016-08-14 NOTE — Telephone Encounter (Signed)
Patient to be seen on 08/14/2016.

## 2016-08-14 NOTE — Patient Instructions (Addendum)
No Celebrex  Okay to use Ultram for pain Take the steroids F/u AS PREVIOUS

## 2016-08-16 ENCOUNTER — Ambulatory Visit: Payer: Medicare Other

## 2016-08-20 ENCOUNTER — Ambulatory Visit: Payer: Medicare Other

## 2016-08-20 DIAGNOSIS — R4701 Aphasia: Secondary | ICD-10-CM | POA: Diagnosis not present

## 2016-08-20 NOTE — Therapy (Signed)
[facility] [address] [place], [place], [address] Phone: [phone]   Fax:  [phone]  Speech Language Pathology Treatment  Patient Details  Name: Logan Cruz" Logan Cruz MRN: [id number] Date of Birth: September 19, 1941 Referring Provider: Dr. Sarina Ill  Encounter Date: 08/20/2016      End of Session - 08/20/16 0837    Visit Number 9   Number of Visits 17   Date for SLP Re-Evaluation 09/11/16   SLP Start Time 0803   SLP Stop Time  0845   SLP Time Calculation (min) 42 min   Activity Tolerance Patient tolerated treatment well      Past Medical History:  Diagnosis Date  . Arthritis   . GERD (gastroesophageal reflux disease)   . Hypertension     Past Surgical History:  Procedure Laterality Date  . CARPAL TUNNEL RELEASE Bilateral 1994  . ROTATOR CUFF REPAIR Right 1990  . TONSILLECTOMY AND ADENOIDECTOMY  1980's  . VASECTOMY  1972   rh Negative incompatibility    There were no vitals filed for this visit.      Subjective Assessment - 08/20/16 0808    Subjective Pt's hand looks WNL today. "A couple more doses" left.               ADULT SLP TREATMENT - 08/20/16 0810      General Information   Behavior/Cognition Alert;Cooperative;Pleasant mood     Treatment Provided   Treatment provided Cognitive-Linquistic     Pain Assessment   Pain Assessment 0-10   Pain Score 2    Pain Location lt wrist   Pain Descriptors / Indicators Sore   Pain Intervention(s) Premedicated before session     Cognitive-Linquistic Treatment   Treatment focused on Aphasia   Skilled Treatment Mod complex conversation completed with 80-90% success. Pt using compensations of reduced rate, circumlocution, and synonym throughout. Pt demonstrated understanding of this level 10 minute conversation. Strucuted and semistructured multisentence tasks completed with good-excellent speech fluidity.     Assessment /  Recommendations / Plan   Plan Continue with current plan of care     Progression Toward Goals   Progression toward goals Progressing toward goals          SLP Education - 08/20/16 0836    Education provided Yes   Education Details Compensations for anomia   Person(s) Educated Patient   Methods Explanation   Comprehension Verbalized understanding;Returned demonstration          SLP Short Term Goals - 08/20/16 0847      SLP SHORT TERM GOAL #1   Title Pt will perform moderately complex naming tasks with 85% accuracy and rare min A   Time 1   Period Weeks   Status On-going     SLP SHORT TERM GOAL #2   Title Pt will utlize compensations for aphasia during structured tasks with occasiona min A   Status Achieved     SLP SHORT TERM GOAL #3   Title Pt will comprehend mildly complex conversation in quiet environment with 95% accuracy and 1-2 requests for repeitition over 8 minutes.    Status Achieved          SLP Long Term Goals - 08/20/16 0854      SLP LONG TERM GOAL #1   Title Pt will utilize compensations for aphasia over 15 minute mildly complex conversation with rare min A   Time 5   Period Weeks   Status Achieved  SLP LONG TERM GOAL #2   Title Pt and spouse will report carryover of strategies to maximize auditory comprehension over 2 weeks with rare min A   Time 5   Period Weeks   Status On-going          Plan - 08/20/16 [id number]    Clinical Impression Statement Pt's aphasia persists - pt is better with using compensations today in mod complex conversation. Rare SLP questioning cues for comprehension. Continue skilled ST to maximize verbal expression and independence.    Speech Therapy Frequency 2x / week   Duration --  5 weeks   Treatment/Interventions SLP instruction and feedback;Compensatory strategies;Internal/external aids;Environmental controls;Patient/family education;Functional tasks;Multimodal communcation approach   Potential to Achieve Goals Good    Potential Considerations Ability to learn/carryover information   Consulted and Agree with Plan of Care Patient      Patient will benefit from skilled therapeutic intervention in order to improve the following deficits and impairments:   Aphasia    Problem List Patient Active Problem List   Diagnosis Date Noted  . MCI (mild cognitive impairment) 06/19/2016  . Heart palpitations 10/28/2015  . Abnormal EKG 10/28/2015  . BPH (benign prostatic hypertrophy) 06/08/2015  . Rotator cuff syndrome of right shoulder 01/24/2015  . Dementia 01/24/2015  . Tenosynovitis of foot and ankle 04/30/2014  . Essential hypertension, benign 04/20/2014  . Heel spur 04/20/2014  . Erectile dysfunction 04/20/2014  . 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 SOB (shortness of breath) 04/20/2014  . Tinea corporis 04/20/2014  . History of skin cancer 04/20/2014  . Numerous moles 04/20/2014    [facility] ,MS, [place]  08/20/2016, 8:55 AM  [facility] [address] [place], [place], [address] Phone: [phone]   Fax:  [phone]   Name: Logan Cruz" [address] Logan Cruz MRN: [id number] Date of Birth: 09/18/1941

## 2016-08-23 ENCOUNTER — Ambulatory Visit: Payer: Medicare Other | Admitting: Speech Pathology

## 2016-08-23 DIAGNOSIS — R4701 Aphasia: Secondary | ICD-10-CM

## 2016-08-23 NOTE — Therapy (Signed)
[facility] [address] [place], [place], [address] Phone: [phone]   Fax:  [phone]  Speech Language Pathology Treatment  Patient Details  Name: Logan Cruz" P Logan Cruz MRN: [id number] Date of Birth: 04-20-1941 Referring Provider: Dr. Sarina Ill  Encounter Date: 08/23/2016      End of Session - 08/23/16 0921    Visit Number 10   Number of Visits 17   Date for SLP Re-Evaluation 09/11/16   SLP Start Time 0847   SLP Stop Time  0928   SLP Time Calculation (min) 41 min   Activity Tolerance Patient tolerated treatment well      Past Medical History:  Diagnosis Date  . Arthritis   . GERD (gastroesophageal reflux disease)   . Hypertension     Past Surgical History:  Procedure Laterality Date  . CARPAL TUNNEL RELEASE Bilateral 1994  . ROTATOR CUFF REPAIR Right 1990  . TONSILLECTOMY AND ADENOIDECTOMY  1980's  . VASECTOMY  1972   rh Negative incompatibility    There were no vitals filed for this visit.      Subjective Assessment - 08/23/16 0850    Subjective "Every once in a while she had to give me a clue like you do" re: word association homework   Currently in Pain? No/denies               ADULT SLP TREATMENT - 08/23/16 0852      General Information   Behavior/Cognition Alert;Cooperative;Pleasant mood     Treatment Provided   Treatment provided Cognitive-Linquistic     Cognitive-Linquistic Treatment   Treatment focused on Aphasia   Skilled Treatment Moderately complex naming tasks with rare min A to use compensations for dysnomia with 90% success in ST identifying word.  Mildly complex conversation discussing pros and cons of issues with compensations for aphasia with rare min A - and extended time for word finding.      Assessment / Recommendations / Plan   Plan Continue with current plan of care     Progression Toward Goals   Progression toward goals Progressing toward goals           SLP Education - 08/23/16 0918    Education provided Yes   Education Details Aphasia compensations; bring spouse next visit   Person(s) Educated Patient   Methods Explanation   Comprehension Verbalized understanding;Returned demonstration          SLP Short Term Goals - 08/23/16 0920      SLP SHORT TERM GOAL #1   Title Pt will perform moderately complex naming tasks with 85% accuracy and rare min A   Time 1   Period Weeks   Status On-going     SLP SHORT TERM GOAL #2   Title Pt will utlize compensations for aphasia during structured tasks with occasiona min A   Status Achieved     SLP SHORT TERM GOAL #3   Title Pt will comprehend mildly complex conversation in quiet environment with 95% accuracy and 1-2 requests for repeitition over 8 minutes.    Status Achieved          SLP Long Term Goals - 08/23/16 0920      SLP LONG TERM GOAL #1   Title Pt will utilize compensations for aphasia over 15 minute mildly complex conversation with rare min A   Time 5   Period Weeks   Status Achieved     SLP LONG TERM GOAL #2  Title Pt and spouse will report carryover of strategies to maximize auditory comprehension over 2 weeks with rare min A   Time 5   Period Weeks   Status On-going          Plan - 02-Sep-2016 0920    Clinical Impression Statement Pt's aphasia persists - pt is better with using compensations today in mod complex conversation. Pt reports improved communication and wife and family are giving him time for word finding.  I requested spouse attend next session. Continue skilled ST to maximize verbal expression and independence.   [place] Kitchen  Speech Therapy Frequency 2x / week   Treatment/Interventions SLP instruction and feedback;Compensatory strategies;Internal/external aids;Environmental controls;Patient/family education;Functional tasks;Multimodal communcation approach   Potential to Achieve Goals Good   Potential Considerations Ability to learn/carryover  information   Consulted and Agree with Plan of Care Patient     Speech Therapy Progress Note  Dates of Reporting Period: 07/17/16  to 08/22/16  Objective Reports of Subjective Statement: Pt reports improved communication and participation in conversation with spouse and friends  Objective Measurements: Pt 90% accuracy (requiring less than 10% clarification of message) in mildly complex conversation.   Goal Update: LTG 1 achieved, requesting spouse attend next sessions for ongoing LTG 2 and carryover of spouse assistance outside of therapy  Plan: Continue skilled ST  Reason Skilled Services are Required: Maximize verbal expression and carryover of compensation for aphasia for independence  Patient will benefit from skilled therapeutic intervention in order to improve the following deficits and impairments:   Aphasia      G-Codes - 09/02/16 0930    Functional Assessment Tool Used NOMS   Functional Limitations Spoken language expressive   Spoken Language Expression Current Status [phone]) At least 1 percent but less than 20 percent impaired, limited or restricted   Spoken Language Expression Goal Status [id number]) At least 1 percent but less than 20 percent impaired, limited or restricted      Problem List Patient Active Problem List   Diagnosis Date Noted  . MCI (mild cognitive impairment) 06/19/2016  . Heart palpitations 10/28/2015  . Abnormal EKG 10/28/2015  . BPH (benign prostatic hypertrophy) 06/08/2015  . Rotator cuff syndrome of right shoulder 01/24/2015  . Dementia 01/24/2015  . Tenosynovitis of foot and ankle 04/30/2014  . Essential hypertension, benign 04/20/2014  . Heel spur 04/20/2014  . Erectile dysfunction 04/20/2014  . 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 SOB (shortness of breath) 04/20/2014  . Tinea corporis 04/20/2014  . History of skin cancer 04/20/2014  . Numerous moles 04/20/2014    Cruz, Logan Rusk  MS, CCC-SLP Sep 02, 2016, 10:18 AM  [facility] [address] [place], [place], [address] Phone: [phone]   Fax:  [phone]   Name: Logan Cruz" [address] Logan Cruz MRN: [id number] Date of Birth: 05-Oct-1941

## 2016-08-24 ENCOUNTER — Ambulatory Visit: Payer: Medicare Other | Admitting: Internal Medicine

## 2016-08-30 ENCOUNTER — Ambulatory Visit: Payer: Medicare Other

## 2016-08-30 DIAGNOSIS — R4701 Aphasia: Secondary | ICD-10-CM | POA: Diagnosis not present

## 2016-08-30 NOTE — Therapy (Signed)
[facility] [address] [place], [place], [address] Phone: [phone]   Fax:  [phone]  Speech Language Pathology Treatment  Patient Details  Name: Logan Cruz" P Collie Wernick MRN: [id number] Date of Birth: Apr 22, 1941 Referring Provider: Dr. Sarina Ill  Encounter Date: 08/30/2016      End of Session - 08/30/16 0847    Visit Number 11   Number of Visits 17   Date for SLP Re-Evaluation 09/11/16   SLP Start Time 0809  pt 9 minutes late   SLP Stop Time  0846   SLP Time Calculation (min) 37 min   Activity Tolerance Patient tolerated treatment well      Past Medical History:  Diagnosis Date  . Arthritis   . GERD (gastroesophageal reflux disease)   . Hypertension     Past Surgical History:  Procedure Laterality Date  . CARPAL TUNNEL RELEASE Bilateral 1994  . ROTATOR CUFF REPAIR Right 1990  . TONSILLECTOMY AND ADENOIDECTOMY  1980's  . VASECTOMY  1972   rh Negative incompatibility    There were no vitals filed for this visit.      Subjective Assessment - 08/30/16 0812    Subjective Pt's language is worse at night after dinner, inconsistently.    Patient is accompained by: --  Tammi Klippel - wife               ADULT SLP TREATMENT - 08/30/16 0816      General Information   Behavior/Cognition Alert;Cooperative;Pleasant mood     Treatment Provided   Treatment provided Cognitive-Linquistic     Cognitive-Linquistic Treatment   Treatment focused on Aphasia   Skilled Treatment In mod complex description tasks pt req'd min to mod verbal cues from SLP for more detailed descriptions. In conversation pt used      Assessment / Recommendations / Plan   Plan Continue with current plan of care     Progression Toward Goals   Progression toward goals Progressing toward goals          SLP Education - 08/30/16 0851    Education provided Yes   Education Details how spouse can provide cueing help   Person(s) Educated Spouse;Patient   Methods Explanation;Demonstration   Comprehension Verbalized understanding;Returned demonstration          SLP Short Term Goals - 08/30/16 0852      SLP SHORT TERM GOAL #1   Title Pt will perform moderately complex naming tasks with 85% accuracy and rare min A   Status Partially Met     SLP SHORT TERM GOAL #2   Title Pt will utlize compensations for aphasia during structured tasks with occasiona min A   Status Achieved     SLP SHORT TERM GOAL #3   Title Pt will comprehend mildly complex conversation in quiet environment with 95% accuracy and 1-2 requests for repeitition over 8 minutes.    Status Achieved          SLP Long Term Goals - 08/30/16 7564      SLP LONG TERM GOAL #1   Title Pt will utilize compensations for aphasia over 15 minute mildly complex conversation with rare min A   Time 4   Period Weeks   Status Achieved     SLP LONG TERM GOAL #2   Title Pt and spouse will report carryover of strategies to maximize auditory comprehension over 2 weeks with rare min A   Time 4   Period Weeks  Status On-going          Plan - 08/30/16 0848    Clinical Impression Statement Pt's aphasia persists - pt is better with using compensations today in mod complex conversation since last seen by this SLP. Continue skilled ST to maximize verbal expression and independence.    Speech Therapy Frequency 2x / week   Duration 4 weeks   Treatment/Interventions SLP instruction and feedback;Compensatory strategies;Internal/external aids;Environmental controls;Patient/family education;Functional tasks;Multimodal communcation approach   Potential to Achieve Goals Good   Potential Considerations Ability to learn/carryover information   Consulted and Agree with Plan of Care Patient      Patient will benefit from skilled therapeutic intervention in order to improve the following deficits and impairments:   Aphasia    Problem List Patient Active  Problem List   Diagnosis Date Noted  . MCI (mild cognitive impairment) 06/19/2016  . Heart palpitations 10/28/2015  . Abnormal EKG 10/28/2015  . BPH (benign prostatic hypertrophy) 06/08/2015  . Rotator cuff syndrome of right shoulder 01/24/2015  . Dementia 01/24/2015  . Tenosynovitis of foot and ankle 04/30/2014  . Essential hypertension, benign 04/20/2014  . Heel spur 04/20/2014  . Erectile dysfunction 04/20/2014  . 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 SOB (shortness of breath) 04/20/2014  . Tinea corporis 04/20/2014  . History of skin cancer 04/20/2014  . Numerous moles 04/20/2014    [name] ,MS, [place]  08/30/2016, 8:53 AM  [facility]: [place] [facility] [address] [place], [place], [address] Phone: [phone]   Fax:  [phone]   Name: Logan Cruz" [address] Harrie Cazarez MRN: [id number] Date of Birth: September 07, 1941

## 2016-09-11 ENCOUNTER — Ambulatory Visit: Payer: Medicare Other | Attending: Neurology | Admitting: Speech Pathology

## 2016-09-11 DIAGNOSIS — R4701 Aphasia: Secondary | ICD-10-CM | POA: Diagnosis present

## 2016-09-11 NOTE — Therapy (Signed)
[facility] [address] [place], [place], [address] Phone: [phone]   Fax:  [phone]  Speech Language Pathology Treatment  Patient Details  Name: Logan Raz" Logan Cruz MRN: [id number] Date of Birth: 1941-01-02 Referring Provider: Dr. Sarina Ill  Encounter Date: 09/11/2016      End of Session - 09/11/16 1219    Visit Number 12   Number of Visits 17   Date for SLP Re-Evaluation 09/11/16   SLP Start Time 0846   SLP Stop Time  0932   SLP Time Calculation (min) 46 min   Activity Tolerance Patient tolerated treatment well      Past Medical History:  Diagnosis Date  . Arthritis   . GERD (gastroesophageal reflux disease)   . Hypertension     Past Surgical History:  Procedure Laterality Date  . CARPAL TUNNEL RELEASE Bilateral 1994  . ROTATOR CUFF REPAIR Right 1990  . TONSILLECTOMY AND ADENOIDECTOMY  1980's  . VASECTOMY  1972   rh Negative incompatibility    There were no vitals filed for this visit.      Subjective Assessment - 09/11/16 0851    Subjective "My talking is worse at night"   Currently in Pain? No/denies               ADULT SLP TREATMENT - 09/11/16 0853      General Information   Behavior/Cognition Alert;Cooperative;Pleasant mood     Treatment Provided   Treatment provided Cognitive-Linquistic     Cognitive-Linquistic Treatment   Treatment focused on Aphasia   Skilled Treatment Reviewed compensations for aphasia. Pt reports his language is better and that his wife also thinks he is doing better.  Pt utilized compensations during simple 15 minute conversation with rare request to clarify message. Instructed pt re: languge activites he can do at home now and upon d/c.      Assessment / Recommendations / Plan   Plan Continue with current plan of care     Progression Toward Goals   Progression toward goals Progressing toward goals          SLP Education -  09/11/16 1217    Education provided Yes   Education Details language enhancing activities to do at home   Person(s) Educated Patient;Spouse   Methods Explanation;Demonstration;Handout   Comprehension Verbalized understanding;Returned demonstration          SLP Short Term Goals - 09/11/16 1218      SLP SHORT TERM GOAL #1   Title Pt will perform moderately complex naming tasks with 85% accuracy and rare min A   Status Partially Met     SLP SHORT TERM GOAL #2   Title Pt will utlize compensations for aphasia during structured tasks with occasiona min A   Status Achieved     SLP SHORT TERM GOAL #3   Title Pt will comprehend mildly complex conversation in quiet environment with 95% accuracy and 1-2 requests for repeitition over 8 minutes.    Status Achieved          SLP Long Term Goals - 09/11/16 1218      SLP LONG TERM GOAL #1   Title Pt will utilize compensations for aphasia over 15 minute mildly complex conversation with rare min A   Time 3   Period Weeks   Status Achieved     SLP LONG TERM GOAL #2   Title Pt and spouse will report carryover of strategies to maximize auditory comprehension over 2  weeks with rare min A   Time 3   Period Weeks   Status On-going          Plan - 09/11/16 1218    Clinical Impression Statement Pt's aphasia persists - pt is better with using compensations today in mod complex conversation since last seen by this SLP. Continue skilled ST to maximize verbal expression and independence.    Speech Therapy Frequency 2x / week   Duration --  3 weeks   Treatment/Interventions SLP instruction and feedback;Compensatory strategies;Internal/external aids;Environmental controls;Patient/family education;Functional tasks;Multimodal communcation approach   Potential to Achieve Goals Good   Potential Considerations Ability to learn/carryover information   Consulted and Agree with Plan of Care Patient      Patient will benefit from skilled therapeutic  intervention in order to improve the following deficits and impairments:   Aphasia    Problem List Patient Active Problem List   Diagnosis Date Noted  . MCI (mild cognitive impairment) 06/19/2016  . Heart palpitations 10/28/2015  . Abnormal EKG 10/28/2015  . BPH (benign prostatic hypertrophy) 06/08/2015  . Rotator cuff syndrome of right shoulder 01/24/2015  . Dementia 01/24/2015  . Tenosynovitis of foot and ankle 04/30/2014  . Essential hypertension, benign 04/20/2014  . Heel spur 04/20/2014  . Erectile dysfunction 04/20/2014  . 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 SOB (shortness of breath) 04/20/2014  . Tinea corporis 04/20/2014  . History of skin cancer 04/20/2014  . Numerous moles 04/20/2014    Laurens Matheny, Annye Rusk MS, CCC-SLP 09/11/2016, 12:20 PM  [facility] [address] [place], [place], [address] Phone: [phone]   Fax:  [phone]   Name: Logan Delange" [address] Logan Cruz MRN: [id number] Date of Birth: Nov 28, 1941

## 2016-09-11 NOTE — Patient Instructions (Signed)
   Get the persons attention before you speak  Use eye contact and face the person you are speaking to  Be in close proximity to the person you are speaking to  Turn down any noise in the environment such as the TV, walk away from loud appliances, air conditioners, fans, dish washers etc  Have friends and family let you find the word or use descriptions, related words,  gestures  instead of filling it in for you  You will need to continue to work on your language daily even when you graduate from speech therapy  Constant Therapy App has a free 30 day trial - it would be good to get this to continue to practice your language. If you get this, you can bring in your tablet and we can work a little to help you get set up - or your family can help.   Games where you have to describe a word for the other person to guess and vice versa are good for you to [place]  Is a good game- or naming things in a given category

## 2016-09-13 ENCOUNTER — Ambulatory Visit: Payer: Medicare Other | Admitting: Speech Pathology

## 2016-09-13 DIAGNOSIS — R4701 Aphasia: Secondary | ICD-10-CM | POA: Diagnosis not present

## 2016-09-13 NOTE — Patient Instructions (Signed)
   Constant Therapy App  Category Therapy App  Can purchase Workbook For Aphasia on [employer] - if you do don't write in it, just do them aloud  [facility] book for word finding; [facility] for aphasia rehab may be on [employer], naming items in a given category such as name types of trees, Christmas/Easter/Halloween items, presidents, Company secretary, actors, comedians, gas stations, musicians  Set a time limit 10-15 minutes for language games/work to avoid frustration

## 2016-09-14 NOTE — Therapy (Signed)
[facility] [address] [place], [place], [address] Phone: [phone]   Fax:  [phone]  Speech Language Pathology Treatment  Patient Details  Name: Logan Cruz" [address] Logan Cruz MRN: [id number] Date of Birth: Nov 10, 1941 Referring Provider: Dr. Sarina Ill  Encounter Date: 09/13/2016      End of Session - 09/14/16 1409    Visit Number 13   Number of Visits 17   Date for SLP Re-Evaluation 09/11/16      Past Medical History:  Diagnosis Date  . Arthritis   . GERD (gastroesophageal reflux disease)   . Hypertension     Past Surgical History:  Procedure Laterality Date  . CARPAL TUNNEL RELEASE Bilateral 1994  . ROTATOR CUFF REPAIR Right 1990  . TONSILLECTOMY AND ADENOIDECTOMY  1980's  . VASECTOMY  1972   rh Negative incompatibility    There were no vitals filed for this visit.      Subjective Assessment - 09/13/16 0851    Subjective "It's better but still worse at night"   Patient is accompained by: Family member   Special Tests spouse Jolayne Haines   Currently in Pain? No/denies               ADULT SLP TREATMENT - 09/13/16 0852      General Information   Behavior/Cognition Alert;Cooperative;Pleasant mood     Treatment Provided   Treatment provided Cognitive-Linquistic     Cognitive-Linquistic Treatment   Treatment focused on Aphasia   Skilled Treatment Wife Jolayne Haines present - discussed possibility of progressive aphasia due to unknown etiology of aphasia.  Trained [place] and pt in language activities that can be carried over to home to maintain verbal expression. Trained spouse for written and verbal cues to assist pt with word finding. She reports "I was giving his less cues at home and he was getting frustrated. Pt with appropriate cueing today with rare min A     Assessment / Recommendations / Plan   Plan All goals met     Progression Toward Goals   Progression toward goals Goals met,  education completed, patient discharged from [facility]  Visits from Start of Care: 13  Current functional level related to goals / functional outcomes: See goals below   Remaining deficits: Aphasia   Education / Equipment: Compensations for aphasia, cueing to assist pt with word finding, language enhancing activities to continue at home Plan: Patient agrees to discharge.  Patient goals were met. Patient is being discharged due to meeting the stated rehab goals.  ?????           SLP Short Term Goals - 09/14/16 1409      SLP SHORT TERM GOAL #1   Title Pt will perform moderately complex naming tasks with 85% accuracy and rare min A   Status Partially Met     SLP SHORT TERM GOAL #2   Title Pt will utlize compensations for aphasia during structured tasks with occasiona min A   Status Achieved     SLP SHORT TERM GOAL #3   Title Pt will comprehend mildly complex conversation in quiet environment with 95% accuracy and 1-2 requests for repeitition over 8 minutes.    Status Achieved          SLP Long Term Goals - 09/14/16 1409      SLP LONG TERM GOAL #1   Title Pt will utilize compensations for aphasia over 15 minute mildly  complex conversation with rare min A   Time 3   Period Weeks   Status Achieved     SLP LONG TERM GOAL #2   Title Pt and spouse will report carryover of strategies to maximize auditory comprehension over 2 weeks with rare min A   Time 3   Period Weeks   Status Achieved          Plan - Oct 07, 2016 1407    Clinical Impression Statement Pt has met goals for compensations for aphasia and is participating in conversations with successful use of compensations. His wife has been trained on written and verbal cues for word finding All goals met, d/c ST   Speech Therapy Frequency 2x / week   Treatment/Interventions SLP instruction and feedback;Compensatory strategies;Internal/external aids;Environmental  controls;Patient/family education;Functional tasks;Multimodal communcation approach   Potential to Achieve Goals Good   Potential Considerations Ability to learn/carryover information   Consulted and Agree with Plan of Care Patient   Family Member Consulted spouse Jolayne Haines      Patient will benefit from skilled therapeutic intervention in order to improve the following deficits and impairments:   Aphasia      G-Codes - Oct 07, 2016 1409    Functional Assessment Tool Used NOMS   Functional Limitations Spoken language expressive   Spoken Language Expression Goal Status [phone]) At least 1 percent but less than 20 percent impaired, limited or restricted   Spoken Language Expression Discharge Status [phone]) At least 1 percent but less than 20 percent impaired, limited or restricted      Problem List Patient Active Problem List   Diagnosis Date Noted  . MCI (mild cognitive impairment) 06/19/2016  . Heart palpitations 10/28/2015  . Abnormal EKG 10/28/2015  . BPH (benign prostatic hypertrophy) 06/08/2015  . Rotator cuff syndrome of right shoulder 01/24/2015  . Dementia 01/24/2015  . Tenosynovitis of foot and ankle 04/30/2014  . Essential hypertension, benign 04/20/2014  . Heel spur 04/20/2014  . Erectile dysfunction 04/20/2014  . 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 SOB (shortness of breath) 04/20/2014  . Tinea corporis 04/20/2014  . History of skin cancer 04/20/2014  . Numerous moles 04/20/2014    Logan Cruz, Annye Rusk MS, CCC-SLP 2016-10-07, 2:10 PM  [facility] [address] [place], [place], [address] Phone: [phone]   Fax:  [phone]   Name: Logan Cruz" [address] Logan Cruz MRN: [id number] Date of Birth: 05/02/1941

## 2016-09-20 ENCOUNTER — Encounter: Payer: Self-pay | Admitting: Internal Medicine

## 2016-09-20 ENCOUNTER — Ambulatory Visit (INDEPENDENT_AMBULATORY_CARE_PROVIDER_SITE_OTHER): Payer: Medicare Other | Admitting: Internal Medicine

## 2016-09-20 VITALS — BP 130/80 | HR 57 | Ht 72.0 in | Wt 234.4 lb

## 2016-09-20 DIAGNOSIS — I10 Essential (primary) hypertension: Secondary | ICD-10-CM

## 2016-09-20 DIAGNOSIS — E785 Hyperlipidemia, unspecified: Secondary | ICD-10-CM

## 2016-09-20 DIAGNOSIS — R0602 Shortness of breath: Secondary | ICD-10-CM | POA: Diagnosis not present

## 2016-09-20 DIAGNOSIS — R002 Palpitations: Secondary | ICD-10-CM | POA: Diagnosis not present

## 2016-09-20 NOTE — Patient Instructions (Addendum)
Medication Instructions:  Your physician recommends that you continue on your current medications as directed. Please refer to the Current Medication list given to you today.  Labwork: Your physician recommends that you return for lab work in: [place]   Testing/Procedures: None Ordered  Follow-Up: Your physician wants you to follow-up in: 12 months with Dr Debara Pickett. You will receive a reminder letter in the mail two months in advance. If you don't receive a letter, please call our office to schedule the follow-up appointment.  Any Other Special Instructions Will Be Listed Below (If Applicable).      If you need a refill on your cardiac medications before your next appointment, please call your pharmacy.

## 2016-09-20 NOTE — Progress Notes (Signed)
OFFICE NOTE  Chief Complaint:   Follow-up palpitations  Primary Care Physician: Vic Blackbird, MD  HPI:  Logan Grayer" P Logan Cruz is a 75 year old male is coming referred for evaluation of an irregular heartbeat. Recently he's been having some fluttering or awareness of his heart, but denies any chest pain. He does get some mild shortness of breath. Past surgical history significant for hypertension and GERD. He apparently has seen a cardiologist in the past when he used to live in [place]. There was about 5 years ago when he underwent stress testing at that time which was apparently negative. His mother did have a problem with arrhythmia but no significant coronary disease. He does have a daughter however who had a heart attack this spring. He tells me has a history of sleep apnea but underwent UPPP surgery and is essentially curative of his symptoms. His sleepiness score was only 3 today. He is physically active although does not exercise regularly. 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 EKG in the office today was abnormal indicating sinus bradycardia, minimal voltage criteria for LVH and inferolateral ST and T-wave changes.  Logan Cruz returns today, accompanied by his son. He underwent monitoring and a nuclear stress test for palpitations and chest pain. The monitor showed that he is having PAC's, but no a-fib or other arrhythmias were noted. His nuclear stress test was abnormal, with an EF of 45%, but normal perfusion.  I discussed the findings with him today, including the possibility that the EF may be falsely low secondary to a gating abnormality. I recommend that we get an echocardiogram to assess for EF.  If it remains low, he may need cardiac catheterization. I suspect a possible cause of his cardiomyopathy may be hypertension. BP is still not well controlled today, with diastolic pressure of [id number].  I saw Logan Cruz back in the office today. He's  been taking his low-dose Toprol but does feel better in the fact that he's had no chest pain , mild shortness of breath and no further palpitations. He's not particularly active and could benefit from more exercise and weight loss. Blood pressure is well-controlled today.  His stress test revealed no ischemia however EF is reduced to 45%. I felt this might be related to gating abnormality and ordered echocardiogram which showed normal LV function and EF 55-60%.  09/20/2016  Logan Cruz was seen back in the office today. He feels like his palpitations are now much better controlled with the change in his medicine. Blood pressure is at goal today at 130/80. He has had a history of dyslipidemia with LDL over 120 in the past but has not had a recent lipid assessment. I would recommend a repeat lipid profile and he may need to start on therapy with the goal LDL less than 100. He denies any worsening shortness of breath and no chest pain.  PMHx:  Past Medical History:  Diagnosis Date  . Arthritis   . GERD (gastroesophageal reflux disease)   . Hypertension     Past Surgical History:  Procedure Laterality Date  . CARPAL TUNNEL RELEASE Bilateral 1994  . ROTATOR CUFF REPAIR Right 1990  . TONSILLECTOMY AND ADENOIDECTOMY  1980's  . VASECTOMY  1972   rh Negative incompatibility    FAMHx:  Family History  Problem Relation Age of Onset  . Arthritis Mother   . Hearing loss Mother   . Vision loss Mother   .  Other Mother     rhythm issue  . Glaucoma Mother   . Dementia Father   . Heart disease Daughter 16    MI - spring 2016    SOCHx:   reports that he has never smoked. He has never used smokeless tobacco. He reports that he drinks about 0.6 oz of alcohol per week . He reports that he does not use drugs.  ALLERGIES:  No Known Allergies  ROS: Pertinent items noted in HPI and remainder of comprehensive ROS otherwise negative.  HOME MEDS: Current Outpatient Prescriptions  Medication Sig  Dispense Refill  . amLODipine-benazepril (LOTREL) 5-20 MG capsule TAKE 1 CAPSULE DAILY 90 capsule 3  . aspirin 81 MG tablet Take 81 mg by mouth daily.    . celecoxib (CELEBREX) 100 MG capsule TAKE 1 CAPSULE TWICE DAILY AS NEEDED (DOSE CHANGE) 180 capsule 3  . donepezil (ARICEPT) 10 MG tablet Take 1 tablet (10 mg total) by mouth at bedtime. Start with 1/2 a pill. Increase to a whole pill in 2 weeks. 30 tablet 12  . esomeprazole (NEXIUM) 40 MG capsule TAKE 1 CAPSULE DAILY AT 12 NOON 90 capsule 3  . latanoprost (XALATAN) 0.005 % ophthalmic solution Place 1 drop into both eyes at bedtime.  1  . metoprolol succinate (TOPROL XL) 25 MG 24 hr tablet Take 0.5 tablets (12.5 mg total) by mouth daily. 45 tablet 3  . tadalafil (CIALIS) 20 MG tablet Take 20 mg by mouth as needed for erectile dysfunction.     . tamsulosin (FLOMAX) 0.4 MG CAPS capsule Take 1 capsule (0.4 mg total) by mouth daily. 90 capsule 3  . traMADol-acetaminophen (ULTRACET) 37.5-325 MG tablet Take 1 tablet by mouth 2 (two) times daily as needed. 45 tablet 1   No current facility-administered medications for this visit.     LABS/IMAGING: No results found for this or any previous visit (from the past 48 hour(s)). No results found.  WEIGHTS: Wt Readings from Last 3 Encounters:  09/20/16 234 lb 6.4 oz (106.3 kg)  08/14/16 237 lb (107.5 kg)  08/10/16 239 lb (108.4 kg)    VITALS: BP 130/80 (BP Location: Right Arm, Patient Position: Sitting, Cuff Size: Normal)   Pulse (!) 57   Ht 6' (1.829 m)   Wt 234 lb 6.4 oz (106.3 kg)   BMI 31.79 kg/m   EXAM: General appearance: alert and no distress Lungs: clear to auscultation bilaterally Heart: regular rate and rhythm, S1, S2 normal, no murmur, click, rub or gallop Extremities: extremities normal, atraumatic, no cyanosis or edema Neurologic: Grossly normal  EKG: Sinus bradycardia 57, minimal voltage criteria for LVH and nonspecific ST and T-wave changes  ASSESSMENT: 1. Palpitations  - resolved 2. Abnormal EKG - EF 45%, low risk myoview without ischemia -  Repeat LVEF by echo was 55-60% 3. Mild dyspnea 4. Hypertension-controlled 5. Dyslipidemia  PLAN: 1.   Logan Cruz is doing well with good blood pressure control and improvement in his palpitations. He is on adequate medical therapy however is not on treatment for his cholesterol. This demonstrated an LDL of 120 in the past. I like to recheck that today and if it remains at that level or higher would recommend statin therapy with a goal LDL of closer to 70. Follow-up with me annually or sooner as necessary.  Pixie Casino, MD, [facility] Attending Cardiologist [place] [facility] [place] 09/20/2016, 10:56 AM

## 2016-09-21 LAB — LIPID PANEL
CHOL/HDL RATIO: 6.3 ratio — AB (ref <=5.0)
Cholesterol: 169 mg/dL (ref 125–200)
HDL: 27 mg/dL — AB (ref >=40)
LDL CALC: 112 mg/dL (ref <130)
Triglycerides: 151 mg/dL — ABNORMAL HIGH (ref <150)
VLDL: 30 mg/dL (ref <30)

## 2016-09-24 ENCOUNTER — Encounter: Payer: Self-pay | Admitting: *Deleted

## 2016-11-09 ENCOUNTER — Ambulatory Visit (HOSPITAL_COMMUNITY)
Admission: EM | Admit: 2016-11-09 | Discharge: 2016-11-09 | Disposition: A | Discharge status: Home / Self Care | Payer: Medicare Other | Attending: Emergency Medicine | Admitting: Emergency Medicine

## 2016-11-09 ENCOUNTER — Encounter (HOSPITAL_COMMUNITY): Payer: Self-pay | Admitting: Emergency Medicine

## 2016-11-09 DIAGNOSIS — Z8739 Personal history of other diseases of the musculoskeletal system and connective tissue: Secondary | ICD-10-CM

## 2016-11-09 DIAGNOSIS — M79642 Pain in left hand: Secondary | ICD-10-CM

## 2016-11-09 DIAGNOSIS — M7989 Other specified soft tissue disorders: Secondary | ICD-10-CM

## 2016-11-09 DIAGNOSIS — T63441A Toxic effect of venom of bees, accidental (unintentional), initial encounter: Secondary | ICD-10-CM

## 2016-11-09 MED ORDER — PREDNISONE 20 MG PO TABS: ORAL_TABLET | ORAL | 0 refills | Status: DC

## 2016-11-09 MED ORDER — CEPHALEXIN 500 MG PO CAPS: 500.0000 mg | ORAL_CAPSULE | Freq: Two times a day (BID) | ORAL | 0 refills | Status: DC

## 2016-11-09 NOTE — ED Provider Notes (Signed)
CSN: [id number]     Arrival date & time 11/09/16  1125 History   First MD Initiated Contact with Patient 11/09/16 1152     Chief Complaint  Patient presents with  . Hand Pain   (Consider location/radiation/quality/duration/timing/severity/associated sxs/prior Treatment) HPI Logan Cruz is a 75 y.o. male presenting to UC with wife c/o gradually worsening Left hand pain, swelling and decreased ROM that started yesterday.  He notes he was stung by some bees last week that got into a glove he was wearing but symptoms did not start until yesterday.  He also reports hx of similar pain, swelling, and stiffness of same hand about 6 months ago. He was seen by his PCP who put him on Prednisone for 1 week. Pt notes symptoms resolved within 2-3 days at that time.  He does have a hx of gout, unsure if this pain feels similar. No pain with light touch but he has pain and stiffness with hand movement. Denies fever, chills, n/v/d.   Past Medical History:  Diagnosis Date  . Arthritis   . GERD (gastroesophageal reflux disease)   . Hypertension    Past Surgical History:  Procedure Laterality Date  . CARPAL TUNNEL RELEASE Bilateral 1994  . ROTATOR CUFF REPAIR Right 1990  . TONSILLECTOMY AND ADENOIDECTOMY  1980's  . VASECTOMY  1972   rh Negative incompatibility   Family History  Problem Relation Age of Onset  . Arthritis Mother   . Hearing loss Mother   . Vision loss Mother   . Other Mother     rhythm issue  . Glaucoma Mother   . Dementia Father   . Heart disease Daughter 72    MI - spring 2016   Social History  Substance Use Topics  . Smoking status: Never Smoker  . Smokeless tobacco: Never Used  . Alcohol use 0.6 oz/week    1 Cans of beer per week     Comment: 1-2 drinks per week)    Review of Systems  Constitutional: Negative for chills and fever.  Musculoskeletal: Positive for arthralgias, joint swelling and myalgias.  Skin: Positive for color change. Negative for rash  and wound.  Neurological: Positive for weakness ( Left hand due to pain and stiffness) and numbness (chronic mild numbness in hands).    Allergies  Patient has no known allergies.  Home Medications   Prior to Admission medications   Medication Sig Start Date End Date Taking? Authorizing Provider  amLODipine-benazepril (LOTREL) 5-20 MG capsule TAKE 1 CAPSULE DAILY 02/20/16  Yes Alycia Rossetti, MD  aspirin 81 MG tablet Take 81 mg by mouth daily.   Yes Historical Provider, MD  celecoxib (CELEBREX) 100 MG capsule TAKE 1 CAPSULE TWICE DAILY AS NEEDED (DOSE CHANGE) 05/22/16  Yes Alycia Rossetti, MD  donepezil (ARICEPT) 10 MG tablet Take 1 tablet (10 mg total) by mouth at bedtime. Start with 1/2 a pill. Increase to a whole pill in 2 weeks. 06/18/16  Yes Melvenia Beam, MD  esomeprazole (NEXIUM) 40 MG capsule TAKE 1 CAPSULE DAILY AT 12 NOON 03/26/16  Yes Alycia Rossetti, MD  latanoprost (XALATAN) 0.005 % ophthalmic solution Place 1 drop into both eyes at bedtime. 01/03/16  Yes Historical Provider, MD  metoprolol succinate (TOPROL XL) 25 MG 24 hr tablet Take 0.5 tablets (12.5 mg total) by mouth daily. 12/21/15  Yes Pixie Casino, MD  cephALEXin (KEFLEX) 500 MG capsule Take 1 capsule (500 mg total) by mouth 2 (two) times daily.  11/09/16   Noland Fordyce, PA-C  predniSONE (DELTASONE) 20 MG tablet 3 tabs po day one, then 2 po daily x 4 days 11/09/16   Noland Fordyce, PA-C  tadalafil (CIALIS) 20 MG tablet Take 20 mg by mouth as needed for erectile dysfunction.     Historical Provider, MD  tamsulosin (FLOMAX) 0.4 MG CAPS capsule Take 1 capsule (0.4 mg total) by mouth daily. 05/17/16   Alycia Rossetti, MD  traMADol-acetaminophen (ULTRACET) 37.5-325 MG tablet Take 1 tablet by mouth 2 (two) times daily as needed. 08/10/16   Alycia Rossetti, MD   Meds Ordered and Administered this Visit  Medications - No data to display  BP 178/80 (BP Location: Left Arm)   Pulse 68   Temp 98.2 F (36.8 C) (Oral)   Resp  20   SpO2 100%  No data found.   Physical Exam  Constitutional: He is oriented to person, place, and time. He appears well-developed and well-nourished.  HENT:  Head: Normocephalic and atraumatic.  Eyes: EOM are normal.  Neck: Normal range of motion.  Cardiovascular: Normal rate.   Pulmonary/Chest: Effort normal.  Musculoskeletal: He exhibits edema and tenderness.  Left hand and wrist: mild edema. Limited ROM due to pain and stiffness. Able to gradually close Left fist with help of Right hand moving his fingers.  Diffuse mild tenderness.   Neurological: He is alert and oriented to person, place, and time.  Skin: Skin is warm and dry. There is erythema.  Left hand: two small areas of superficial abrasion, possibly from reported bee stings. Diffuse erythema and warmth w/o induration or fluctuance.   Psychiatric: He has a normal mood and affect. His behavior is normal.  Nursing note and vitals reviewed.   Urgent Care Course   Clinical Course     Procedures (including critical care time)  Labs Review Labs Reviewed - No data to display  Imaging Review No results found.   MDM   1. Left hand pain   2. Swelling of left hand   3. Bee sting, accidental or unintentional, initial encounter   4. History of gout    Pt c/o Left hand redness, pain, and swelling. Symptoms could be due to gout.  Will place on prednisone as that has helped in the past for similar symptoms.  Will also cover for potential skin infection due to recent bee sting last week.  Rx: Keflex and prednisone F/u with PCP or return to UC in 3-4 days if not improving.    Noland Fordyce, PA-C 11/09/16 1224

## 2016-11-09 NOTE — ED Triage Notes (Signed)
Here for left hand pain and swelling onset yest  Reports similar sx 6 months ago  Denies inj/trauma.... But recalls getting stung by bees last week  Also reports hx of gout  A&O x4... NAD

## 2016-11-14 ENCOUNTER — Encounter: Payer: Self-pay | Admitting: Family Medicine

## 2016-11-14 ENCOUNTER — Ambulatory Visit (INDEPENDENT_AMBULATORY_CARE_PROVIDER_SITE_OTHER): Payer: Medicare Other | Admitting: Family Medicine

## 2016-11-14 VITALS — BP 128/74 | HR 62 | Temp 98.6°F | Resp 14 | Ht 72.0 in | Wt 232.0 lb

## 2016-11-14 DIAGNOSIS — M7989 Other specified soft tissue disorders: Secondary | ICD-10-CM

## 2016-11-14 DIAGNOSIS — Z23 Encounter for immunization: Secondary | ICD-10-CM | POA: Diagnosis not present

## 2016-11-14 DIAGNOSIS — M10042 Idiopathic gout, left hand: Secondary | ICD-10-CM | POA: Diagnosis not present

## 2016-11-14 NOTE — Progress Notes (Signed)
   Subjective:    Patient ID: Logan Cruz, male    DOB: [birth year], 75 y.o.   MRN: [id number]  Patient presents for L Wrist Pain (multiple bee stings to L arm- L wisit swollen and tender- pt reports icing wrist- has been seen at UC- given ABTx and steroid-)  Patient here with left wrist swelling pain. He was seen at the urgent care, 11/10. He had recent bee stings to his arm. He was started on prednisone and he was also given Keflex for any possible infection from the staining from last week. His swelling is now gone but states he still gets some pain in the wrist has another day of prednisone in 2 more days of antibiotics  He had a similar problem with swelling in his hand and fingers back in August I concern was for gout. He was prescribed Medrol Dosepak. And he had tramadol for pain The uric acid at that time was 4.9    Review Of Systems:  GEN- denies fatigue, fever, weight loss,weakness, recent illness HEENT- denies eye drainage, change in vision, nasal discharge, CVS- denies chest pain, palpitations RESP- denies SOB, cough, wheeze ABD- denies N/V, change in stools, abd pain GU- denies dysuria, hematuria, dribbling, incontinence MSK- +joint pain, muscle aches, injury Neuro- denies headache, dizziness, syncope, seizure activity       Objective:    BP 128/74 (BP Location: Right Arm, Patient Position: Sitting, Cuff Size: Large)   Pulse 62   Temp 98.6 F (37 C) (Oral)   Resp 14   Ht 6' (1.829 m)   Wt 232 lb (105.2 kg)   SpO2 98%   BMI 31.46 kg/m  GEN- NAD, alert and oriented x3 CVS- RRR, no murmur RESP-CTAB MSK- left hand normal apperance, no erythema, no swelling, able to grasp and make fist , FROM wrist        Assessment & Plan:      Problem List Items Addressed This Visit    None    Visit Diagnoses    Swelling of left hand    -  Primary   Possible delayed reaction to bees, I query more GOUT, based on prevous swelling. Discussed starting allopurinol, he  wants to hold off, make dietary changes for gout as well Complete antibiotics and steroid  REVIEWED Urgent care note during OV    Idiopathic gout of left hand, unspecified chronicity       Need for prophylactic vaccination and inoculation against influenza       Relevant Orders   Flu Vaccine QUAD 36+ mos PF IM (Fluarix & Fluzone Quad PF) (Completed)      Note: This dictation was prepared with Dragon dictation along with smaller phrase technology. Any transcriptional errors that result from this process are unintentional.

## 2016-11-14 NOTE — Patient Instructions (Signed)
Flu shot done Complete the steroid and antibiotics  If this returns starting gout medication F/U as previous

## 2016-11-26 ENCOUNTER — Telehealth: Payer: Self-pay | Admitting: Neurology

## 2016-11-26 NOTE — Telephone Encounter (Signed)
Called to cancel appointment in the morning due to [facility] having an emergency. Called both numbers, either disconnected or voicemail is full. Could not reach patient or leave a message. Would you try again in the morning Logan Cruz when you get to work? Or Logan Cruz, you will b ein at 7am coul dyou try as well? thanks

## 2016-11-27 ENCOUNTER — Ambulatory Visit: Payer: Medicare Other | Admitting: Adult Health

## 2016-11-27 NOTE — Telephone Encounter (Signed)
Called and spoke to pt. Advised MM,NP had emergency and we need to r/s his appt this am. Apologized for any inconvenience. He verbalized understanding and will have his wife call back to r/s appt. She was still sleeping.

## 2016-11-29 ENCOUNTER — Other Ambulatory Visit: Payer: Self-pay | Admitting: *Deleted

## 2016-11-29 ENCOUNTER — Ambulatory Visit: Payer: Medicare Other | Admitting: Adult Health

## 2016-11-29 MED ORDER — METHYLPREDNISOLONE 4 MG PO TBPK: ORAL_TABLET | ORAL | 0 refills | Status: DC

## 2016-11-29 MED ORDER — ALLOPURINOL 100 MG PO TABS: 100.0000 mg | ORAL_TABLET | Freq: Every day | ORAL | 6 refills | Status: AC

## 2016-12-03 ENCOUNTER — Encounter: Payer: Self-pay | Admitting: Adult Health

## 2016-12-05 ENCOUNTER — Other Ambulatory Visit: Payer: Self-pay | Admitting: Internal Medicine

## 2016-12-05 NOTE — Telephone Encounter (Signed)
Rx(s) sent to pharmacy electronically.  

## 2016-12-31 DIAGNOSIS — H409 Unspecified glaucoma: Secondary | ICD-10-CM

## 2016-12-31 HISTORY — DX: Unspecified glaucoma: H40.9

## 2017-01-09 ENCOUNTER — Encounter: Payer: Self-pay | Admitting: Physician Assistant

## 2017-01-09 ENCOUNTER — Ambulatory Visit (INDEPENDENT_AMBULATORY_CARE_PROVIDER_SITE_OTHER): Payer: Medicare Other | Admitting: Physician Assistant

## 2017-01-09 VITALS — BP 130/82 | HR 64 | Temp 97.5°F | Resp 18 | Wt 234.4 lb

## 2017-01-09 DIAGNOSIS — H6123 Impacted cerumen, bilateral: Secondary | ICD-10-CM

## 2017-01-09 NOTE — Progress Notes (Signed)
Patient ID: FRANS GONNERMAN MRN: [id number], DOB: 09-08-1941, 76 y.o. Date of Encounter: 01/09/2017, 3:46 PM    Chief Complaint:  Chief Complaint  Patient presents with  . right ear clogged up     HPI: 76 y.o. year old male presents with above.   He recently went to his audiologist. Was told that his right ear was clogged with wax and had some in the left ear. Was told to return to have it cleaned out. Says that our office is closer. Also says that he has come here to have his ear is cleaned in the past. No other concerns to address today.     Home Meds:   Outpatient Medications Prior to Visit  Medication Sig Dispense Refill  . allopurinol (ZYLOPRIM) 100 MG tablet Take 1 tablet (100 mg total) by mouth daily. 30 tablet 6  . amLODipine-benazepril (LOTREL) 5-20 MG capsule TAKE 1 CAPSULE DAILY 90 capsule 3  . aspirin 81 MG tablet Take 81 mg by mouth daily.    . celecoxib (CELEBREX) 100 MG capsule TAKE 1 CAPSULE TWICE DAILY AS NEEDED (DOSE CHANGE) 180 capsule 3  . donepezil (ARICEPT) 10 MG tablet Take 1 tablet (10 mg total) by mouth at bedtime. Start with 1/2 a pill. Increase to a whole pill in 2 weeks. 30 tablet 12  . esomeprazole (NEXIUM) 40 MG capsule TAKE 1 CAPSULE DAILY AT 12 NOON 90 capsule 3  . latanoprost (XALATAN) 0.005 % ophthalmic solution Place 1 drop into both eyes at bedtime.  1  . methylPREDNISolone (MEDROL DOSEPAK) 4 MG TBPK tablet use as directed 21 tablet 0  . metoprolol succinate (TOPROL-XL) 25 MG 24 hr tablet Take 0.5 tablets (12.5 mg total) by mouth daily. 45 tablet 2  . predniSONE (DELTASONE) 20 MG tablet 3 tabs po day one, then 2 po daily x 4 days 11 tablet 0  . tadalafil (CIALIS) 20 MG tablet Take 20 mg by mouth as needed for erectile dysfunction.     . tamsulosin (FLOMAX) 0.4 MG CAPS capsule Take 1 capsule (0.4 mg total) by mouth daily. 90 capsule 3  . traMADol-acetaminophen (ULTRACET) 37.5-325 MG tablet Take 1 tablet by mouth 2 (two) times daily as needed. 45  tablet 1  . cephALEXin (KEFLEX) 500 MG capsule Take 1 capsule (500 mg total) by mouth 2 (two) times daily. (Patient not taking: Reported on 01/09/2017) 14 capsule 0   No facility-administered medications prior to visit.     Allergies: No Known Allergies    Review of Systems: See HPI for pertinent ROS. All other ROS negative.    Physical Exam: Blood pressure 130/82, pulse 64, temperature 97.5 F (36.4 C), temperature source Oral, resp. rate 18, weight 234 lb 6.4 oz (106.3 kg), SpO2 98 %., Body mass index is 31.79 kg/m. General:  WNWD WM. Appears in no acute distress. HEENT: Normocephalic, atraumatic, eyes without discharge, sclera non-icteric, nares are without discharge.  Right ear canal is completely obstructed with cerumen. Left ear canal is approximately two thirds obstructed with cerumen. Neck: Supple. No thyromegaly. No lymphadenopathy. Lungs: Clear bilaterally to auscultation without wheezes, rales, or rhonchi. Breathing is unlabored. Heart: Regular rhythm. No murmurs, rubs, or gallops. Msk:  Strength and tone normal for age. Extremities/Skin: Warm and dry.  Neuro: Alert and oriented X 3. Moves all extremities spontaneously. Gait is normal. CNII-XII grossly in tact. Psych:  Responds to questions appropriately with a normal affect.     ASSESSMENT AND PLAN:  76 y.o. year old  male with  1. Bilateral impacted cerumen Bilateral ears are irrigated with success---both canals patent at end of irrigation.  He is to use over-the-counter drops to prevent reoccurrence.   [address] [place], [place], [facility] 01/09/2017 3:46 PM

## 2017-01-22 ENCOUNTER — Encounter: Payer: Self-pay | Admitting: Adult Health

## 2017-01-22 ENCOUNTER — Ambulatory Visit (INDEPENDENT_AMBULATORY_CARE_PROVIDER_SITE_OTHER): Payer: Medicare Other | Admitting: Adult Health

## 2017-01-22 VITALS — BP 147/83 | HR 58 | Ht 72.0 in | Wt 235.0 lb

## 2017-01-22 DIAGNOSIS — R413 Other amnesia: Secondary | ICD-10-CM | POA: Diagnosis not present

## 2017-01-22 MED ORDER — MEMANTINE HCL 28 X 5 MG & 21 X 10 MG PO TABS: ORAL_TABLET | ORAL | 0 refills | Status: DC

## 2017-01-22 NOTE — Patient Instructions (Signed)
Continue Aricept Start Namenda If your symptoms worsen or you develop new symptoms please let us know.  Memantine Tablets What is this medicine? MEMANTINE (MEM an teen) is used to treat dementia caused by Alzheimer's disease. This medicine may be used for other purposes; ask your health care provider or pharmacist if you have questions. COMMON BRAND NAME(S): Namenda What should I tell my health care provider before I take this medicine? They need to know if you have any of these conditions: -difficulty passing urine -kidney disease -liver disease -seizures -an unusual or allergic reaction to memantine, other medicines, foods, dyes, or preservatives -pregnant or trying to get pregnant -breast-feeding How should I use this medicine? Take this medicine by mouth with a glass of water. Follow the directions on the prescription label. You may take this medicine with or without food. Take your doses at regular intervals. Do not take your medicine more often than directed. Continue to take your medicine even if you feel better. Do not stop taking except on the advice of your doctor or health care professional. Talk to your pediatrician regarding the use of this medicine in children. Special care may be needed. Overdosage: If you think you have taken too much of this medicine contact a poison control center or emergency room at once. NOTE: This medicine is only for you. Do not share this medicine with others. What if I miss a dose? If you miss a dose, take it as soon as you can. If it is almost time for your next dose, take only that dose. Do not take double or extra doses. If you do not take your medicine for several days, contact your health care provider. Your dose may need to be changed. What may interact with this medicine? -acetazolamide -amantadine -cimetidine -dextromethorphan -dofetilide -hydrochlorothiazide -ketamine -metformin -methazolamide -quinidine -ranitidine -sodium  bicarbonate -triamterene This list may not describe all possible interactions. Give your health care provider a list of all the medicines, herbs, non-prescription drugs, or dietary supplements you use. Also tell them if you smoke, drink alcohol, or use illegal drugs. Some items may interact with your medicine. What should I watch for while using this medicine? Visit your doctor or health care professional for regular checks on your progress. Check with your doctor or health care professional if there is no improvement in your symptoms or if they get worse. You may get drowsy or dizzy. Do not drive, use machinery, or do anything that needs mental alertness until you know how this drug affects you. Do not stand or sit up quickly, especially if you are an older patient. This reduces the risk of dizzy or fainting spells. Alcohol can make you more drowsy and dizzy. Avoid alcoholic drinks. What side effects may I notice from receiving this medicine? Side effects that you should report to your doctor or health care professional as soon as possible: -allergic reactions like skin rash, itching or hives, swelling of the face, lips, or tongue -agitation or a feeling of restlessness -depressed mood -dizziness -hallucinations -redness, blistering, peeling or loosening of the skin, including inside the mouth -seizures -vomiting Side effects that usually do not require medical attention (report to your doctor or health care professional if they continue or are bothersome): -constipation -diarrhea -headache -nausea -trouble sleeping This list may not describe all possible side effects. Call your doctor for medical advice about side effects. You may report side effects to FDA at 1-800-FDA-1088. Where should I keep my medicine? Keep out of the reach  of children. Store at room temperature between 15 degrees and 30 degrees C (59 degrees and 86 degrees F). Throw away any unused medicine after the expiration  date. NOTE: This sheet is a summary. It may not cover all possible information. If you have questions about this medicine, talk to your doctor, pharmacist, or health care provider.  2017 Elsevier/Gold Standard (2013-10-05 14:10:42)

## 2017-01-22 NOTE — Progress Notes (Signed)
PATIENT: Logan Cruz DOB: 09/17/1941   REASON FOR VISIT: follow up-memory disturbance HISTORY FROM: patient  HISTORY OF PRESENT ILLNESS: Mr. Arts is a 76 year old male with a history of memory disturbance. He returns today for follow-up. He is currently on Aricept and tolerating it well. He feels that his memory has remained the same. He lives at home with his wife. He is able to complete all ADLs independently. He does operate a Teacher, music. He states that he has only had 1 occasion in which she got lost. He states that it was very brief. Wife states that she handles all the finances and bills. Patient states that he is sleeping okay. He states that he normally gets no more than 6 hours of sleep each night. Denies any changes in mood or behavior. He returns today for an evaluation.   HISTORY 06/18/16: Logan Cruz "[place]" P Logan Cruz is a 76 y.o. male here as a referral from Dr. Buelah Manis for memory loss, abnormal MRI of the brain. Memory problems started several years ago. Worsening, progressive. Forget where he put things, forgets what he needs to do. Wife here are provides more that 50% of information. He can't always rememebr things, she has to tell him multiple times, he forgets what she told him. He can't find his words, he knows what he wants to say but finds it difficult in coming out, he will start to talk and he has to stop and think about things. He understands what people say to him, just difficulty expressing himself, he also has hearing difficulty which could contribute. He had sleep evaluation and he had surgery with tonsil and adenoids out, he snores, very fatigued during the day. Wife has also noticed he loses his temper more eaily, frustrated especially if she tells him to do something. No depression. He gets frustrated with himself. Memories are clearer in the past. Father with dementia, alzheimer's.   Reviewed notes, labs and imaging from outside physicians, which  showed:  Personally reviewed MRI of the brain and agree with the following:  There is moderate global cerebral atrophy. Prominent CSF spaces overlying both cerebral convexities are felt to predominantly be due to atrophy, however asymmetric CSF overlying the right frontal lobe measures up to 9 mm in thickness and likely reflects a small subdural hygroma with slight frontal gyral flattening. Scattered foci of T2 hyperintensity in the cerebral white matter bilaterally are nonspecific but compatible with mild chronic small vessel ischemic disease.  Orbits are unremarkable. Paranasal sinuses and mastoid air cells are clear. Major intracranial vascular flow voids are preserved. Calvarial bone marrow signal is unremarkable.  IMPRESSION: 1. No acute intracranial abnormality. 2. 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 3. Small right frontal subdural hygroma.  Reviewed lab values, TSH, RPR normal.   REVIEW OF SYSTEMS: Out of a complete 14 system review of symptoms, the patient complains only of the following symptoms, and all other reviewed systems are negative.  See history of present illness  ALLERGIES: No Known Allergies  HOME MEDICATIONS: Outpatient Medications Prior to Visit  Medication Sig Dispense Refill  . allopurinol (ZYLOPRIM) 100 MG tablet Take 1 tablet (100 mg total) by mouth daily. 30 tablet 6  . amLODipine-benazepril (LOTREL) 5-20 MG capsule TAKE 1 CAPSULE DAILY 90 capsule 3  . aspirin 81 MG tablet Take 81 mg by mouth daily.    . celecoxib (CELEBREX) 100 MG capsule TAKE 1 CAPSULE TWICE DAILY AS NEEDED (DOSE CHANGE) 180 capsule 3  .  donepezil (ARICEPT) 10 MG tablet Take 1 tablet (10 mg total) by mouth at bedtime. Start with 1/2 a pill. Increase to a whole pill in 2 weeks. 30 tablet 12  . esomeprazole (NEXIUM) 40 MG capsule TAKE 1 CAPSULE DAILY AT 12 NOON 90 capsule 3  . latanoprost (XALATAN) 0.005 % ophthalmic solution Place 1 drop into both  eyes at bedtime.  1  . metoprolol succinate (TOPROL-XL) 25 MG 24 hr tablet Take 0.5 tablets (12.5 mg total) by mouth daily. 45 tablet 2  . tadalafil (CIALIS) 20 MG tablet Take 20 mg by mouth as needed for erectile dysfunction.     . tamsulosin (FLOMAX) 0.4 MG CAPS capsule Take 1 capsule (0.4 mg total) by mouth daily. 90 capsule 3  . traMADol-acetaminophen (ULTRACET) 37.5-325 MG tablet Take 1 tablet by mouth 2 (two) times daily as needed. 45 tablet 1  . cephALEXin (KEFLEX) 500 MG capsule Take 1 capsule (500 mg total) by mouth 2 (two) times daily. (Patient not taking: Reported on 01/09/2017) 14 capsule 0  . methylPREDNISolone (MEDROL DOSEPAK) 4 MG TBPK tablet use as directed 21 tablet 0  . predniSONE (DELTASONE) 20 MG tablet 3 tabs po day one, then 2 po daily x 4 days 11 tablet 0   No facility-administered medications prior to visit.     PAST MEDICAL HISTORY: Past Medical History:  Diagnosis Date  . Arthritis   . GERD (gastroesophageal reflux disease)   . Glaucoma 12/2016  . Hypertension     PAST SURGICAL HISTORY: Past Surgical History:  Procedure Laterality Date  . CARPAL TUNNEL RELEASE Bilateral 1994  . ROTATOR CUFF REPAIR Right 1990  . TONSILLECTOMY AND ADENOIDECTOMY  1980's  . VASECTOMY  1972   rh Negative incompatibility    FAMILY HISTORY: Family History  Problem Relation Age of Onset  . Arthritis Mother   . Hearing loss Mother   . Vision loss Mother   . Other Mother     rhythm issue  . Glaucoma Mother   . Dementia Father   . Heart disease Daughter 94    MI - spring 2016    SOCIAL HISTORY: Social History   Social History  . Marital status: Married    Spouse name: Pamala Hurry  . Number of children: 3  . Years of education: 12   Occupational History  . Retired Animator    Social History Main Topics  . Smoking status: Never Smoker  . Smokeless tobacco: Never Used  . Alcohol use 0.6 oz/week    1 Cans of beer per week     Comment: 1-2 drinks per week)  . Drug  use: No  . Sexual activity: Yes    Birth control/ protection: Surgical   Other Topics Concern  . Not on file   Social History Narrative   Lives with spouse   Caffeine use: none reported   Epworth Sleepiness Scale = 3 (1028/2016)      PHYSICAL EXAM  Vitals:   01/22/17 0934  Weight: 235 lb (106.6 kg)  Height: 6' (1.829 m)   Body mass index is 31.87 kg/m.   MMSE - Mini Mental State Exam 01/22/2017 06/18/2016  Orientation to time 2 4  Orientation to Place 3 4  Registration 3 3  Attention/ Calculation 1 0  Recall 1 2  Language- name 2 objects 2 2  Language- repeat 1 1  Language- follow 3 step command 3 3  Language- read & follow direction 1 1  Write a sentence 1 0  Copy design 0 1  Total score 18 21     Generalized: Well developed, in no acute distress   Neurological examination  Mentation: Alert oriented to time, place, history taking. Follows all commands speech and language fluent Cranial nerve II-XII: Pupils were equal round reactive to light. Extraocular movements were full, visual field were full on confrontational test. Facial sensation and strength were normal. Uvula tongue midline. Head turning and shoulder shrug  were normal and symmetric. Motor: The motor testing reveals 5 over 5 strength of all 4 extremities. Good symmetric motor tone is noted throughout.  Sensory: Sensory testing is intact to soft touch on all 4 extremities. No evidence of extinction is noted.  Coordination: Cerebellar testing reveals good finger-nose-finger and heel-to-shin bilaterally.  Gait and station: Gait is normal. Tandem gait is normal. Romberg is negative. No drift is seen.  Reflexes: Deep tendon reflexes are symmetric and normal bilaterally.   DIAGNOSTIC DATA (LABS, IMAGING, TESTING) - I reviewed patient records, labs, notes, testing and imaging myself where available.  Lab Results  Component Value Date   WBC 6.6 08/14/2016   HGB 15.1 08/14/2016   HCT 45.3 08/14/2016   MCV  86.8 08/14/2016   PLT 181 08/14/2016      Component Value Date/Time   NA 139 05/01/2016 1229   K 4.3 05/01/2016 1229   CL 105 05/01/2016 1229   CO2 22 05/01/2016 1229   GLUCOSE 82 05/01/2016 1229   BUN 19 05/01/2016 1229   CREATININE 0.96 05/01/2016 1229   CALCIUM 9.0 05/01/2016 1229   PROT 7.1 05/01/2016 1229   ALBUMIN 4.0 05/01/2016 1229   AST 39 (H) 05/01/2016 1229   ALT 44 05/01/2016 1229   ALKPHOS 102 05/01/2016 1229   BILITOT 0.6 05/01/2016 1229   GFRNONAA 86 04/21/2014 0836   GFRAA >89 04/21/2014 0836   Lab Results  Component Value Date   CHOL 169 09/20/2016   HDL 27 (L) 09/20/2016   LDLCALC 112 09/20/2016   TRIG 151 (H) 09/20/2016   CHOLHDL 6.3 (H) 09/20/2016   Lab Results  Component Value Date   HGBA1C 6.3 (H) 05/01/2016   Lab Results  Component Value Date   VITAMINB12 398 06/18/2016   Lab Results  Component Value Date   TSH 4.15 05/01/2016      ASSESSMENT AND PLAN 76 y.o. year old male  has a past medical history of Arthritis; GERD (gastroesophageal reflux disease); Glaucoma (12/2016); and Hypertension. here with:  1. Memory disturbance  The patient's memory score has slightly declined. The patient will remain on Aricept. We will start on Namenda as well. I have reviewed side effects with the patient and his wife. I also provided him with a handout. Patient and his wife advised that if his symptoms worsen or he develops new symptoms he should let us know. He will follow-up in 6 months or sooner if needed.  I spent 15 minutes with the patient. 50% of this time was spent reviewing medication-namenda.   Ward Givens, MSN, NP-C 01/22/2017, 9:46 AM [facility] [address] [place], [place] [address] [phone]

## 2017-02-03 ENCOUNTER — Other Ambulatory Visit: Payer: Self-pay | Admitting: Family Medicine

## 2017-02-19 ENCOUNTER — Telehealth: Payer: Self-pay | Admitting: Adult Health

## 2017-02-19 MED ORDER — MEMANTINE HCL 10 MG PO TABS: 10.0000 mg | ORAL_TABLET | Freq: Two times a day (BID) | ORAL | 4 refills | Status: AC

## 2017-02-19 MED ORDER — MEMANTINE HCL 10 MG PO TABS: 10.0000 mg | ORAL_TABLET | Freq: Two times a day (BID) | ORAL | 0 refills | Status: DC

## 2017-02-19 NOTE — Telephone Encounter (Signed)
Called and no answer.

## 2017-02-19 NOTE — Telephone Encounter (Signed)
Patients wife called in reference to memantine [facility] TITRATION PAK) tablet pack.  Patient has about 4 days left and she was wanting to know if he is going to get a prescription for the medication.  Pharmacy is [facility] mail order, but she doesn't think the mail in order will get there quick enough so would like to know if he can have prescription sent to [facility] [address] to get him thru about 2 weeks.  Please call

## 2017-02-19 NOTE — Telephone Encounter (Signed)
Prescriptions sent for 2 wks at [facility] and [facility]. Memantine 10mg  po bid, and also 1 yr prescription to [facility].  Relayed to Ms. Owens Shark and she verbalized understanding.  Pt tolerating med ok.

## 2017-02-19 NOTE — Addendum Note (Signed)
Addended byOliver Hum on: 02/19/2017 03:03 PM   Modules accepted: Orders

## 2017-03-10 NOTE — Progress Notes (Signed)
Personally  participated in, made any corrections needed, and agree with history, physical, neuro exam,assessment and plan as stated above.    Antonia Ahern, MD [facility] 

## 2017-03-13 ENCOUNTER — Other Ambulatory Visit: Payer: Self-pay | Admitting: Family Medicine

## 2017-05-31 DEATH — deceased

## 2017-06-12 ENCOUNTER — Encounter: Payer: Self-pay | Admitting: *Deleted

## 2017-07-23 ENCOUNTER — Ambulatory Visit: Payer: Medicare Other | Admitting: Adult Health
